# Patient Record
Sex: Male | Born: 1965 | Race: White | Hispanic: No | Marital: Married | Smoking: Former smoker
Health system: Southern US, Community
[De-identification: ages and names within clinical notes are randomized; demographics above are authoritative.]

## PROBLEM LIST (undated history)

## (undated) DIAGNOSIS — K219 Gastro-esophageal reflux disease without esophagitis: Secondary | ICD-10-CM

## (undated) DIAGNOSIS — F17200 Nicotine dependence, unspecified, uncomplicated: Secondary | ICD-10-CM

## (undated) DIAGNOSIS — D126 Benign neoplasm of colon, unspecified: Secondary | ICD-10-CM

## (undated) DIAGNOSIS — E119 Type 2 diabetes mellitus without complications: Secondary | ICD-10-CM

## (undated) DIAGNOSIS — T7840XA Allergy, unspecified, initial encounter: Secondary | ICD-10-CM

## (undated) DIAGNOSIS — R7989 Other specified abnormal findings of blood chemistry: Secondary | ICD-10-CM

## (undated) DIAGNOSIS — F32A Depression, unspecified: Secondary | ICD-10-CM

## (undated) DIAGNOSIS — F329 Major depressive disorder, single episode, unspecified: Secondary | ICD-10-CM

## (undated) DIAGNOSIS — E785 Hyperlipidemia, unspecified: Secondary | ICD-10-CM

## (undated) DIAGNOSIS — F419 Anxiety disorder, unspecified: Secondary | ICD-10-CM

## (undated) HISTORY — DX: Depression, unspecified: F32.A

## (undated) HISTORY — DX: Nicotine dependence, unspecified, uncomplicated: F17.200

## (undated) HISTORY — PX: TONSILLECTOMY: SUR1361

## (undated) HISTORY — DX: Type 2 diabetes mellitus without complications: E11.9

## (undated) HISTORY — DX: Benign neoplasm of colon, unspecified: D12.6

## (undated) HISTORY — DX: Anxiety disorder, unspecified: F41.9

## (undated) HISTORY — PX: WISDOM TOOTH EXTRACTION: SHX21

## (undated) HISTORY — PX: SHOULDER SURGERY: SHX246

## (undated) HISTORY — DX: Major depressive disorder, single episode, unspecified: F32.9

## (undated) HISTORY — DX: Gastro-esophageal reflux disease without esophagitis: K21.9

## (undated) HISTORY — DX: Hyperlipidemia, unspecified: E78.5

## (undated) HISTORY — DX: Allergy, unspecified, initial encounter: T78.40XA

## (undated) HISTORY — DX: Other specified abnormal findings of blood chemistry: R79.89

---

## 2008-11-27 ENCOUNTER — Encounter: Admission: RE | Admit: 2008-11-27 | Discharge: 2008-11-27 | Payer: Self-pay | Admitting: Emergency Medicine

## 2009-02-10 ENCOUNTER — Ambulatory Visit (HOSPITAL_BASED_OUTPATIENT_CLINIC_OR_DEPARTMENT_OTHER): Admission: RE | Admit: 2009-02-10 | Discharge: 2009-02-10 | Payer: Self-pay | Admitting: Orthopedic Surgery

## 2011-02-01 LAB — POCT HEMOGLOBIN-HEMACUE: Hemoglobin: 17.9 g/dL — ABNORMAL HIGH (ref 13.0–17.0)

## 2011-03-07 NOTE — Op Note (Signed)
David Dyer, David Dyer                 ACCOUNT NO.:  [id number]   MEDICAL RECORD NO.:  [id number]          PATIENT TYPE:  AMB   LOCATION:  DSC                          FACILITY:  [facility]   PHYSICIAN:  Feliberto Gottron. Turner Daniels, M.D.   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   POSTOPERATIVE DIAGNOSIS:  Left shoulder acromioclavicular  joint  arthritis, impingement syndrome, and degenerative tearing of the  superior labrum.   PROCEDURE:  Left shoulder arthroscopic anterior-inferior acromioplasty,  formal distal clavicle excision, and debridement of degenerative tearing  of the superior labrum.   SURGEON:  Feliberto Gottron. Turner Daniels, MD   FIRST ASSISTANT:  Shirl Harris, PA-C   ANESTHETIC:  General endotracheal plus interscalene block.   ESTIMATED BLOOD LOSS:  Minimal.   FLUID REPLACEMENT:  800 mL of crystalloid.   DRAINS PLACED:  None.   TOURNIQUET TIME:  None.   INDICATIONS FOR PROCEDURE:  The patient is a 45 year old gentleman with  left shoulder AC joint arthritis with cystic changes in the distal  clavicle and the acromion, also has symptoms consistent with impingement  syndrome and possible labral tears.  He has failed conservative  treatment with anti-inflammatory medicines and physical therapy, got  good temporary relief from differential injection of the [facility] joint in the  subacromial space and now desires elective arthroscopic evaluation and  treatment of his left shoulder including distal clavicle excision,  acromioplasty, and dressing in the intraarticular pathology, we may find  such as a labral tear.  He is at low risk for a rotator cuff tear by  clinical exam.  The risks and benefits of surgery have been discussed.  Questions are answered.   DESCRIPTION OF PROCEDURE:  The  patient was identified by armband and  taken to the Block Area of [facility].  Appropriate  anesthetic monitors were attached and left interscalene block anesthetic  was induced.  He was then taken to the operating room #6.  Appropriate  anesthetic monitors were reattached and general endotracheal anesthesia  was induced.  He was placed in the beach chair position and the left  upper extremity was prepped and draped in the usual sterile fashion from  the wrist to the hemithorax.  A standard time-out procedure was  performed and at this point we began the operation itself by making  portals 1.5 cm anterior to the [facility] joint with a #11 blade lateral to the  junction middle and posterior thirds of the acromion and posterior of  the posterolateral corner of the acromion process.  The inflow was  placed anteriorly, the arthroscope laterally, and a 4.2 great white  sucker shaver posteriorly allowing subacromial bursectomy which revealed  a fairly normal rotator cuff and a badly arthritic AC joint with  fragmentation of the distal clavicle and a type 2 subacromial spur.  Electrocautery was maintained with the ArthroCare wand at this point a  subacromial decompression  was accomplished using a 4.5 hooded Vortex bur  as well as removal of the distal inferior centimeter of the clavicle.  We then switched portals bringing the scope in posteriorly, the inflow  laterally, and the 4.5 hooded Vortex bur anteriorly completing the  distal clavicle excision.  The arthroscope was then repositioned in the  glenohumeral joint with the inflow attached, allowing [place] to evaluate the  glenohumeral joint.  The articular cartilage was in good condition of  the glenoid and humerus.  The subscapularis, supraspinatus, and  infraspinatus insertions were all intact.  The biceps tendon was in good  condition, but the superior labrum had degenerative tearing along the  inner rim and this was debrided back to a  stable margin using a 3.5  Gator sucker shaver coming in from the anterior portal.  We then probed  the labrum to make sure it was intact peripherally and the shoulder was  irrigated out with normal saline solution and a dressing of Xeroform, 4  x 4 dressing sponges, paper tape, and a sling applied.  The patient was  laid supine, awakened, and taken to the recovery room without  difficulty.      Feliberto Gottron. Turner Daniels, M.D.  Electronically Signed     FJR/MEDQ  D:  02/10/2009  T:  02/11/2009  Job:  [id number]

## 2011-06-07 ENCOUNTER — Ambulatory Visit (INDEPENDENT_AMBULATORY_CARE_PROVIDER_SITE_OTHER): Payer: BC Managed Care – PPO | Admitting: Medical

## 2011-06-07 ENCOUNTER — Encounter: Payer: Self-pay | Admitting: Medical

## 2011-06-07 DIAGNOSIS — F329 Major depressive disorder, single episode, unspecified: Secondary | ICD-10-CM

## 2011-06-07 DIAGNOSIS — R030 Elevated blood-pressure reading, without diagnosis of hypertension: Secondary | ICD-10-CM

## 2011-06-07 DIAGNOSIS — Z0000 Encounter for general adult medical examination without abnormal findings: Secondary | ICD-10-CM

## 2011-06-07 DIAGNOSIS — G5620 Lesion of ulnar nerve, unspecified upper limb: Secondary | ICD-10-CM

## 2011-06-07 DIAGNOSIS — R209 Unspecified disturbances of skin sensation: Secondary | ICD-10-CM

## 2011-06-07 DIAGNOSIS — F17200 Nicotine dependence, unspecified, uncomplicated: Secondary | ICD-10-CM

## 2011-06-07 DIAGNOSIS — R202 Paresthesia of skin: Secondary | ICD-10-CM

## 2011-06-07 LAB — CBC WITH DIFFERENTIAL/PLATELET
Basophils Absolute: 0.1 10*3/uL (ref 0.0–0.1)
Basophils Relative: 1 % (ref 0–1)
Eosinophils Absolute: 0.2 10*3/uL (ref 0.0–0.7)
Hemoglobin: 18.9 g/dL — ABNORMAL HIGH (ref 13.0–17.0)
MCH: 33.3 pg (ref 26.0–34.0)
MCHC: 35.6 g/dL (ref 30.0–36.0)
Monocytes Absolute: 0.8 10*3/uL (ref 0.1–1.0)
Monocytes Relative: 6 % (ref 3–12)
Neutrophils Relative %: 65 % (ref 43–77)
RDW: 13.5 % (ref 11.5–15.5)

## 2011-06-07 LAB — POCT URINALYSIS DIPSTICK
Glucose, UA: NEGATIVE
Nitrite, UA: NEGATIVE
Urobilinogen, UA: NEGATIVE

## 2011-06-07 LAB — COMPREHENSIVE METABOLIC PANEL
ALT: 67 U/L — ABNORMAL HIGH (ref 0–53)
AST: 43 U/L — ABNORMAL HIGH (ref 0–37)
Albumin: 5.1 g/dL (ref 3.5–5.2)
Alkaline Phosphatase: 56 U/L (ref 39–117)
BUN: 21 mg/dL (ref 6–23)
Chloride: 103 mEq/L (ref 96–112)
Potassium: 4.5 mEq/L (ref 3.5–5.3)
Sodium: 138 mEq/L (ref 135–145)

## 2011-06-07 LAB — TSH: TSH: 2.835 u[IU]/mL (ref 0.350–4.500)

## 2011-06-07 LAB — LIPID PANEL
Cholesterol: 259 mg/dL — ABNORMAL HIGH (ref 0–200)
LDL Cholesterol: 178 mg/dL — ABNORMAL HIGH (ref 0–99)
VLDL: 34 mg/dL (ref 0–40)

## 2011-06-07 LAB — VITAMIN B12: Vitamin B-12: 662 pg/mL (ref 211–911)

## 2011-06-07 MED ORDER — BUPROPION HCL ER (XL) 150 MG PO TB24: 150.0000 mg | ORAL_TABLET | ORAL | Status: DC

## 2011-06-07 NOTE — Progress Notes (Signed)
Subjective:   HPI  David Dyer is a 45 y.o. male who presents for a complete physical.  He hasn't been to the doctor in a few years. He wanted to come in to discuss several concerns.  He is here with his wife today.  He first notes several months of depressed mood. His father passed away in 12-03-2022 of cancer, and he took care of his father last 2 weeks of his life. This has deeply affected him and he is having a hard time getting over this.  This may have been partially related to him losing his job. He has crying spells, depressed mood, lack of desire to do things he once did. He notes in the remote past being on Paxil and Lexapro but this caused erectile dysfunction problems. The medication didn't seem to help his mood either. He typically has avoided counseling, as he has trust issues, does not want to open up to counselors.  He is not in church and does not have a Education officer, environmental.  He notes for months now he gets numbness in the outside of his lower legs, he also gets numbness in both hands mainly the last 3 fingers on the side. He is a Curator, and does Journalist, newspaper and auto glass work. He uses hands with his job and wondered if this caused the numbness.  He is a smoker, and would like to have help quitting smoking. He used Chantix briefly in the past but this made him very angry. He was able to quit smoking for brief period in the remote past with Zyban.  He notes a history of borderline cholesterol in the past. He wants general labs for screening today.  Reviewed their medical, surgical, family, social, medication, and allergy history and updated chart as appropriate.  Past Medical History  Diagnosis Date  . Tobacco use disorder     Past Surgical History  Procedure Date  . Shoulder surgery     arthroscopic - [facility]  . Tonsillectomy     Family History  Problem Relation Age of Onset  . Hyperlipidemia Mother   . Hypertension Mother   . Heart disease Mother 75    MI  . Cancer  Father     bladder  . Diabetes Father   . Alcohol abuse Father   . Cancer Maternal Grandmother     lung cancer  . Heart disease Maternal Grandfather   . Diabetes Paternal Grandfather   . Stroke Neg Hx     History   Social History  . Marital Status: Married    Spouse Name: N/A    Number of Children: N/A  . Years of Education: N/A   Occupational History  . Not on file.   Social History Main Topics  . Smoking status: Current Everyday Smoker -- 1.0 packs/day for 25 years    Types: Cigarettes  . Smokeless tobacco: Never Used  . Alcohol Use: 7.2 oz/week    12 Cans of beer per week  . Drug Use: No  . Sexually Active: Not on file     married, 1 daughter, unemployed.  Glass technician, Journalist, newspaper   Other Topics Concern  . Not on file   Social History Narrative  . No narrative on file    No current outpatient prescriptions on file prior to visit.    No Known Allergies   Review of Systems Constitutional: denies fever, chills, sweats, unexpected weight change, anorexia, fatigue Allergy: negative; denies recent sneezing, itching, congestion Dermatology: denies changing  moles, rash, lumps, new worrisome lesions ENT: no runny nose, ear pain, sore throat, hoarseness, sinus pain, teeth pain, tinnitus, hearing loss, epistaxis Cardiology: denies chest pain, palpitations, edema, orthopnea, paroxysmal nocturnal dyspnea Respiratory: denies cough, shortness of breath, dyspnea on exertion, wheezing, hemoptysis Gastroenterology: denies abdominal pain, nausea, vomiting, diarrhea, constipation, blood in stool, changes in bowel movement, dysphagia Hematology: denies bleeding or bruising problems Musculoskeletal: denies arthralgias, myalgias, joint swelling, back pain, neck pain, cramping, gait changes Ophthalmology: denies vision changes, eye redness, itching, discharge Urology: denies dysuria, difficulty urinating, hematuria, urinary frequency, urgency, incontinence Neurology:  +tingling and numbness in both outer lower legs, bilat hands, mostly last 3 fingers bilat,; no headache, weakness, speech abnormality, memory loss, falls, dizziness Psychology: +depressed mood     Objective:   Physical Exam  Filed Vitals:   06/07/11 0814  BP: 132/90  Pulse: 68  Temp: 98.1 F (36.7 C)  Resp: 20    General appearance: alert, no distress, WD/WN, white male HEENT: normocephalic, conjunctiva/corneas normal, sclerae anicteric, PERRLA, EOMi, nares patent, no discharge or erythema, pharynx normal Oral cavity: MMM, tongue normal, teeth normal Neck: supple, no lymphadenopathy, no thyromegaly, no masses, normal ROM, no JVD or bruits Chest: non tender, normal shape and expansion Heart: RRR, normal S1, S2, no murmurs Lungs: CTA bilaterally, no wheezes, rhonchi, or rales Abdomen: +bs, ventral reducible hernia noted on exam, soft, non tender, non distended, no masses, no hepatomegaly, no splenomegaly, no bruits Back: non tender, normal ROM, no scoliosis Musculoskeletal: upper extremities non tender, no obvious deformity, normal ROM throughout, lower extremities non tender, no obvious deformity, normal ROM throughout Extremities: no edema, no cyanosis, no clubbing Pulses: 2+ symmetric, upper and lower extremities, normal cap refill Neurological: alert, oriented x 3, CN2-12 intact, strength normal upper extremities and lower extremities, sensation normal throughout, DTRs 2+ throughout, no cerebellar signs, gait normal Psychiatric: tearful at times talking about his father; otherwise normal affect, behavior normal, pleasant  GU: Normal male external genitalia, no hernias, masses Skin: Left lower abdomen with a somewhat square flat macule, 6 mm x 6 mm, mostly brown but was a couple shades of brown.  Right neck with 1 cm x 4 mm pink, raised, somewhat verruca lesion, unchanged for years per patient.  Several scattered flat and benign appearing macules of the chest and back.   Assessment  :    Encounter Diagnoses  Name Primary?  . General medical examination Yes  . Tobacco use disorder   . Depression   . Paresthesia   . Ulnar nerve entrapment syndrome   . Elevated blood pressure (not hypertension)       Plan:    Physical exam - discussed healthy lifestyle, diet, exercise, preventative care, vaccinations, and addressed their concerns. Handout given on prevention and wellness.  Tobacco use disorder-discussed risk of tobacco use, advised he stop smoking, and he is interested in stop smoking.  Begin Wellbutrin, gave 1 800 quit now hotline and recommend he call for counseling.  Of note, he quit successfully with this in the past briefly.  Depression - advise he consider counseling do to degrees he has suffered with the loss of his father.  Begin Wellbutrin. Discussed risk and benefits of medication.  Advise he get in church and developed relationship with a pastor for counseling.   Recheck here in 3-4 weeks, call sooner prn.  Paresthesias-we will check a B12 and other labs today, not sure about the cause of his leg numbness and tingling  Ulnar nerve entrapment syndrome-advise he  have his wife wrap his arms in a towel to keep them straight during sleep, but if things progressively worsen then we will consider EMGs  Elevated BP - recheck next visit.

## 2011-06-07 NOTE — Patient Instructions (Signed)
Preventative Care for Adults, Male    1-800-QUIT-NOW hotline for tobacco cessation.      REGULAR HEALTH EXAMS:  A routine yearly physical is a good way to check in with your primary care provider about your health and preventive screening. It is also an opportunity to share updates about your health and any concerns you have, and receive a thorough all-over exam.   Most health insurance companies pay for at least some preventative services.  Check with your health plan for specific coverages.  WHAT PREVENTATIVE SERVICES DO MEN NEED?  Adult men should have their weight and blood pressure checked regularly.   Men age 81 and older should have their cholesterol levels checked regularly.  Beginning at age 61 and continuing to age 41, men should be screened for colorectal cancer.  Certain people should may need continued testing until age 57.  Other cancer screening may include exams for testicular and prostate cancer.  Updating vaccinations is part of preventative care.  Vaccinations help protect against diseases such as the flu.  Lab tests are generally done as part of preventative care to screen for anemia and blood disorders, to screen for problems with the kidneys and liver, to screen for bladder problems, to check blood sugar, and to check your cholesterol level.  Preventative services generally include counseling about diet, exercise, avoiding tobacco, drugs, excessive alcohol consumption, and sexually transmitted infections.    GENERAL RECOMMENDATIONS FOR GOOD HEALTH:  Healthy diet:  Eat a variety of foods, including fruit, vegetables, animal or vegetable protein, such as meat, fish, chicken, and eggs, or beans, lentils, tofu, and grains, such as rice.  Drink plenty of water daily.  Decrease saturated fat in the diet, avoid lots of red meat, processed foods, sweets, fast foods, and fried foods.  Exercise:  Aerobic exercise helps maintain good heart health. At least 30-40  minutes of moderate-intensity exercise is recommended. For example, a brisk walk that increases your heart rate and breathing. This should be done on most days of the week.   Find a type of exercise or a variety of exercises that you enjoy so that it becomes a part of your daily life.  Examples are running, walking, swimming, water aerobics, and biking.  For motivation and support, explore group exercise such as aerobic class, spin class, Zumba, Yoga,or  martial arts, etc.    Set exercise goals for yourself, such as a certain weight goal, walk or run in a race such as a 5k walk/run.  Speak to your primary care provider about exercise goals.  Disease prevention:  If you smoke or chew tobacco, find out from your caregiver how to quit. It can literally save your life, no matter how long you have been a tobacco user. If you do not use tobacco, never begin.   Maintain a healthy diet and normal weight. Increased weight leads to problems with blood pressure and diabetes.   The Body Mass Index or BMI is a way of measuring how much of your body is fat. Having a BMI above 27 increases the risk of heart disease, diabetes, hypertension, stroke and other problems related to obesity. Your caregiver can help determine your BMI and based on it develop an exercise and dietary program to help you achieve or maintain this important measurement at a healthful level.  High blood pressure causes heart and blood vessel problems.  Persistent high blood pressure should be treated with medicine if weight loss and exercise do not work.   Fat  and cholesterol leaves deposits in your arteries that can block them. This causes heart disease and vessel disease elsewhere in your body.  If your cholesterol is found to be high, or if you have heart disease or certain other medical conditions, then you may need to have your cholesterol monitored frequently and be treated with medication.   Ask if you should have a stress test if your  history suggests this. A stress test is a test done on a treadmill that looks for heart disease. This test can find disease prior to there being a problem.  Avoid drinking alcohol in excess (more than two drinks per day).  Avoid use of street drugs. Do not share needles with anyone. Ask for professional help if you need assistance or instructions on stopping the use of alcohol, cigarettes, and/or drugs.  Brush your teeth twice a day with fluoride toothpaste, and floss once a day. Good oral hygiene prevents tooth decay and gum disease. The problems can be painful, unattractive, and can cause other health problems. Visit your dentist for a routine oral and dental check up and preventive care every 6-12 months.   Look at your skin regularly.  Use a mirror to look at your back. Notify your caregivers of changes in moles, especially if there are changes in shapes, colors, a size larger than a pencil eraser, an irregular border, or development of new moles.  Safety:  Use seatbelts 100% of the time, whether driving or as a passenger.  Use safety devices such as hearing protection if you work in environments with loud noise or significant background noise.  Use safety glasses when doing any work that could send debris in to the eyes.  Use a helmet if you ride a bike or motorcycle.  Use appropriate safety gear for contact sports.  Talk to your caregiver about gun safety.  Use sunscreen with a SPF (or skin protection factor) of 15 or greater.  Lighter skinned people are at a greater risk of skin cancer. Don't forget to also wear sunglasses in order to protect your eyes from too much damaging sunlight. Damaging sunlight can accelerate cataract formation.   Practice safe sex. Use condoms. Condoms are used for birth control and to help reduce the spread of sexually transmitted infections (or STIs).  Some of the STIs are gonorrhea (the clap), chlamydia, syphilis, trichomonas, herpes, HPV (human papilloma virus) and  HIV (human immunodeficiency virus) which causes AIDS. The herpes, HIV and HPV are viral illnesses that have no cure. These can result in disability, cancer and death.   Keep carbon monoxide and smoke detectors in your home functioning at all times. Change the batteries every 6 months or use a model that plugs into the wall.   Vaccinations:  Stay up to date with your tetanus shots and other required immunizations. You should have a booster for tetanus every 10 years. Be sure to get your flu shot every year, since 5%-20% of the [place] population comes down with the flu. The flu vaccine changes each year, so being vaccinated once is not enough. Get your shot in the fall, before the flu season peaks.   Other vaccines to consider:  Pneumococcal vaccine to protect against certain types of pneumonia.  This is normally recommended for adults age 78 or older.  However, adults younger than 44 years old with certain underlying conditions such as diabetes, heart or lung disease should also receive the vaccine.  Shingles vaccine to protect against Varicella Zoster if you  are older than age 10, or younger than 44 years old with certain underlying illness.  Hepatitis A vaccine to protect against a form of infection of the liver by a virus acquired from food.  Hepatitis B vaccine to protect against a form of infection of the liver by a virus acquired from blood or body fluids, particularly if you work in health care.  If you plan to travel internationally, check with your local health department for specific vaccination recommendations.  Cancer Screening:  Most routine colon cancer screening begins at the age of 20. On a yearly basis, doctors may provide special easy to use take-home tests to check for hidden blood in the stool. Sigmoidoscopy or colonoscopy can detect the earliest forms of colon cancer and is life saving. These tests use a small camera at the end of a tube to directly examine the colon. Speak to  your caregiver about this at age 16, when routine screening begins (and is repeated every 5 years unless early forms of pre-cancerous polyps or small growths are found).   At the age of 78 men usually start screening for prostate cancer every year. Screening may begin at a younger age for those with higher risk. Those at higher risk include African-Americans or having a family history of prostate cancer. There are two types of tests for prostate cancer:   Prostate-specific antigen (PSA) testing. Recent studies raise questions about prostate cancer using PSA and you should discuss this with your caregiver.   Digital rectal exam (in which your doctor's lubricated and gloved finger feels for enlargement of the prostate through the anus).   Screening for testicular cancer.  Do a monthly exam of your testicles. Gently roll each testicle between your thumb and fingers, feeling for any abnormal lumps. The best time to do this is after a hot shower or bath when the tissues are looser. Notify your caregivers of any lumps, tenderness or changes in size or shape immediately.

## 2011-06-09 ENCOUNTER — Encounter: Payer: Self-pay | Admitting: *Deleted

## 2011-06-09 ENCOUNTER — Telehealth: Payer: Self-pay | Admitting: *Deleted

## 2011-06-09 NOTE — Telephone Encounter (Addendum)
Message copied by Dorthula Perfect on Fri Jun 09, 2011  5:02 PM ------      Message from: [place], [place]      Created: Fri Jun 09, 2011 12:56 PM       There are few abnormalities on labs including elevated liver test, elevated sugar, elevated cholesterol, and hemoglobin is elevated. Otherwise labs were okay. Let's have him come back in 2 weeks to discuss labs and medication Wellbutrin. He will need to do some additional studies at that time.  Left message on vm to return call regarding labs.  Sent letter and copy of labs to pt to call and schedule an appointment for 2 weeks to go over lab results and to discuss Wellbutrin.  CM, LPN

## 2011-06-19 ENCOUNTER — Encounter: Payer: Self-pay | Admitting: Medical

## 2011-06-19 ENCOUNTER — Ambulatory Visit (INDEPENDENT_AMBULATORY_CARE_PROVIDER_SITE_OTHER): Payer: BC Managed Care – PPO | Admitting: Medical

## 2011-06-19 VITALS — BP 112/88 | HR 60 | Temp 98.2°F | Resp 20 | Ht 69.0 in | Wt 193.5 lb

## 2011-06-19 DIAGNOSIS — R209 Unspecified disturbances of skin sensation: Secondary | ICD-10-CM

## 2011-06-19 DIAGNOSIS — E785 Hyperlipidemia, unspecified: Secondary | ICD-10-CM

## 2011-06-19 DIAGNOSIS — D582 Other hemoglobinopathies: Secondary | ICD-10-CM

## 2011-06-19 DIAGNOSIS — R748 Abnormal levels of other serum enzymes: Secondary | ICD-10-CM

## 2011-06-19 DIAGNOSIS — F17200 Nicotine dependence, unspecified, uncomplicated: Secondary | ICD-10-CM

## 2011-06-19 DIAGNOSIS — F329 Major depressive disorder, single episode, unspecified: Secondary | ICD-10-CM

## 2011-06-19 DIAGNOSIS — R030 Elevated blood-pressure reading, without diagnosis of hypertension: Secondary | ICD-10-CM

## 2011-06-19 DIAGNOSIS — R202 Paresthesia of skin: Secondary | ICD-10-CM

## 2011-06-19 DIAGNOSIS — R7301 Impaired fasting glucose: Secondary | ICD-10-CM

## 2011-06-19 NOTE — Progress Notes (Signed)
Subjective:   HPI David Dyer is a 45 y.o. male who presents for recheck. I saw him as a new patient on 06/07/11 for physical.  At that time he had multiple concerns including depressed mood, desires stop tobacco, numbness and tingling on and legs, and general screening.  His labs came back showing elevated glucose, elevated liver test, elevated hemoglobin, high cholesterol.  He reports no prior history of elevated liver test or abnormal glucose.  He does report drinking 2 or more alcoholic beverages a day for years, but in the last 2 weeks has tried to cut back on this.  Given his father's death earlier this year, he does report that he was self-medicating with alcohol and Xanax.  He still reports ongoing tingling and numbness in the outside of his legs.  At times his feet feel cold.  He denies pain in his legs or feet though. No other aggravating or relieving factors.  No other c/o.  The following portions of the patient's history were reviewed and updated as appropriate: allergies, current medications, past family history, past medical history, past social history, past surgical history and problem list.  Past Medical History  Diagnosis Date  . Tobacco use disorder     Review of Systems Gen.: No fever, chills, sweats, weight changes Skin: No rashes, erythema Heart: No chest pain, palpitations Lungs: No shortness breath, cough Abdomen: No abdominal pain, nausea, vomiting, blood in stool GU: Negative Neuro: No weakness, loss of bowel or bladder function    Objective:   Physical Exam  General appearance: alert, no distress, WD/WN, white male Skin: unremarkable Musculoskeletal: Lower extremities non-tender, normal range of motion, no obvious deformity Back: Mild lumbar tenderness paraspinally and midline, range of motion about 90% of normal, -SLR Neuro: Sensation seems a little blunted throughout his lower legs, although he is able to distinguish light and sharp touch, lower extremity DTRs  normal, strength normal Pulse:  1+ lower extremity pulses, symmetric, upper extremity pulses normal Extremities: no edema, cyanosis or clubbing   Assessment :    Encounter Diagnoses  Name Primary?  . Impaired fasting glucose Yes  . Elevated liver enzymes   . Hyperlipidemia   . Depressive disorder, not elsewhere classified   . Tobacco use disorder   . Elevated blood pressure reading without diagnosis of hypertension   . Abnormal hemoglobin   . Paresthesia      Plan:     Impaired fasting glucose-hemoglobin A1c 6.1% today.  Advised he is at risk for diabetes. We discussed diet and exercise changes. Recheck labs in 6 month  Elevated liver enzymes-likely alcohol related.  His recent AST was 43, ALT 67. Advise he obtained from alcohol or greatly reduced his intake, and we discussed other workup. At this point we will recheck liver enzymes in 3 months. If still elevated, we will get workup to evaluate elevated liver test  Hyperlipidemia-discussed diet changes, increase whole-grain in diet, avoid lots of red meat, avoid processed foods, exercise regularly, recheck labs in 6 months  Depression-continue Wellbutrin, call report in 2 weeks.  If not at goal in 2 weeks we'll increase to Wellbutrin XR 300 mg daily.  Tobacco use disorder-he is improving, has been tobacco free for 3 days now.  Continue Wellbutrin, call report in 2 weeks  Elevated blood pressure-improved on check today  Abnormal hemoglobin-likely tobacco related.  Recheck labs in 3 months  Paresthesia- likely related to his elevated blood sugar and alcohol use.  Recheck on this in 3 months. In  the meantime can try over-the-counter tonic water, one half cup daily, or try magnesium and potassium tablets over-the-counter daily.  If no improvement in 3 months, consider ABIs an EMG/nerve conduction studies

## 2011-07-06 ENCOUNTER — Telehealth: Payer: Self-pay | Admitting: *Deleted

## 2011-07-06 ENCOUNTER — Other Ambulatory Visit: Payer: Self-pay | Admitting: Medical

## 2011-07-06 MED ORDER — BUPROPION HCL ER (XL) 300 MG PO TB24: 300.0000 mg | ORAL_TABLET | ORAL | Status: DC

## 2011-07-06 NOTE — Telephone Encounter (Signed)
Wellbutrin 300 sent.  F/u in 50mo recheck on this.

## 2011-07-06 NOTE — Telephone Encounter (Signed)
Pt aware of prescription sent to pharmacy and will call back in a month to schedule an appointment. CM, LPN

## 2011-07-10 ENCOUNTER — Encounter: Payer: Self-pay | Admitting: Medical

## 2011-07-10 ENCOUNTER — Ambulatory Visit (INDEPENDENT_AMBULATORY_CARE_PROVIDER_SITE_OTHER): Payer: BC Managed Care – PPO | Admitting: Medical

## 2011-07-10 VITALS — BP 120/82 | HR 78 | Temp 98.2°F | Ht 70.0 in | Wt 199.0 lb

## 2011-07-10 DIAGNOSIS — R209 Unspecified disturbances of skin sensation: Secondary | ICD-10-CM

## 2011-07-10 DIAGNOSIS — R202 Paresthesia of skin: Secondary | ICD-10-CM

## 2011-07-10 DIAGNOSIS — F329 Major depressive disorder, single episode, unspecified: Secondary | ICD-10-CM

## 2011-07-10 DIAGNOSIS — F17200 Nicotine dependence, unspecified, uncomplicated: Secondary | ICD-10-CM

## 2011-07-10 NOTE — Progress Notes (Signed)
Subjective:   HPI  David Dyer is a 45 y.o. male who presents for recheck.    Here for recheck on depression.  He is seeing improvement on Wellbutrin XR 300mg  daily.  He has been on the 150mg  dose up until last week.  He still has moments when gets agitated.  He is not working currently.  He is still mourning the loss of his father, going through his father's belongings now to try and get a handle on things.  He is trying to settle his father's estate.  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  His wife currently is an administrative person at her job, but will likely become part of his business once things get moving. He has not seen a Veterinary surgeon.  He has quit smoking 3wk ago.   Wellbutrin is really helping in this regard.    He still has some numbness in his legs, but less so than prior.  No other new c/o with this.   Here for recheck on elevated BP from his first visit.  At his first visit here his BP was elevated, but it has not been elevated since.  He notes that he was nervous and certainly not feeling that well at his first visit.  No other c/o.  The following portions of the patient's history were reviewed and updated as appropriate: allergies, current medications, past family history, past medical history, past social history, past surgical history and problem list.  Past Medical History  Diagnosis Date  . Tobacco use disorder   . Depression     Review of Systems Constitutional: denies fever, chills, sweats, unexpected weight change Gastroenterology: denies abdominal pain, nausea, vomiting, diarrhea, constipation Musculoskeletal: denies arthralgias, myalgias, joint swelling, back pain Urology: denies dysuria, difficulty urinating, hematuria, urinary frequency, urgency     Objective:   Physical Exam  General appearance: alert, no distress, WD/WN, white male Psych: pleasant, good eye contact  Assessment :   Encounter Diagnoses  Name Primary?  . Depression  Yes  . Tobacco use disorder   . Paresthesia      Plan:    Depression - We recently increased him to Wellbutrin 300 mg XR last week.  We will give this more time.  Discussed his concerns, agitation, mood, and discussed ways to cope. He declines counseling at this time.  We talked about goal setting, having a daily "to do" list, keeping busy, getting exercise.  He will call report in 2wk to let me know if the higher dose of Wellbutrin seems to be working ok.   Tobacco use disorder - he quit 3 wk ago.   Congratulated him on his efforts.  He will hopefully remain tobacco free.   Paresthesia - somewhat improved.  Recheck on labs in 2-3 mo.

## 2011-09-19 ENCOUNTER — Encounter: Payer: Self-pay | Admitting: Medical

## 2011-09-19 ENCOUNTER — Ambulatory Visit (INDEPENDENT_AMBULATORY_CARE_PROVIDER_SITE_OTHER): Payer: BC Managed Care – PPO | Admitting: Medical

## 2011-09-19 VITALS — BP 120/80 | HR 68 | Temp 98.5°F | Resp 16 | Wt 209.0 lb

## 2011-09-19 DIAGNOSIS — F329 Major depressive disorder, single episode, unspecified: Secondary | ICD-10-CM

## 2011-09-19 DIAGNOSIS — N529 Male erectile dysfunction, unspecified: Secondary | ICD-10-CM

## 2011-09-19 DIAGNOSIS — E785 Hyperlipidemia, unspecified: Secondary | ICD-10-CM | POA: Insufficient documentation

## 2011-09-19 DIAGNOSIS — R748 Abnormal levels of other serum enzymes: Secondary | ICD-10-CM

## 2011-09-19 LAB — HEPATIC FUNCTION PANEL
ALT: 43 U/L (ref 0–53)
Albumin: 4.6 g/dL (ref 3.5–5.2)
Total Protein: 7.2 g/dL (ref 6.0–8.3)

## 2011-09-19 LAB — LIPID PANEL
Cholesterol: 244 mg/dL — ABNORMAL HIGH (ref 0–200)
LDL Cholesterol: 160 mg/dL — ABNORMAL HIGH (ref 0–99)
Triglycerides: 248 mg/dL — ABNORMAL HIGH (ref <150)

## 2011-09-19 MED ORDER — BUPROPION HCL ER (XL) 300 MG PO TB24: 300.0000 mg | ORAL_TABLET | ORAL | Status: DC

## 2011-09-19 MED ORDER — SILDENAFIL CITRATE 50 MG PO TABS: 50.0000 mg | ORAL_TABLET | ORAL | Status: DC | PRN

## 2011-09-19 NOTE — Progress Notes (Signed)
Subjective:   HPI  David Dyer is a 45 y.o. male who presents for recheck.   Last visit was September regarding depression.  He is here for recheck today on lab abnormalities in August - cholesterol and liver abnormalities.  Since starting on Wellbutrin a few months ago, he feels like this medicine has really helped him keep even on his mood. Unfortunately though he continues to deal with settling his father's estate who passed away this past year, and he notes that his only brother tragically died from an accidental gunshot wound within the past couple months. He was out hunting and apparently the gun accidentally fired, and autopsy was done, no foul play suspected. He says he is handling things reasonably well, and at times gets anxious, but overall the Wellbutrin has made a big difference. He has a daughter who lives in [place] finishing out her college. Otherwise he has been in good health, no particular complaints.  He did restart smoking after the death of his brother, but had been smoke-free for months.  No other c/o.  The following portions of the patient's history were reviewed and updated as appropriate: allergies, current medications, past family history, past medical history, past social history, past surgical history and problem list.  Past Medical History  Diagnosis Date  . Tobacco use disorder   . Depression    Review of Systems Constitutional: -fever, -chills, -sweats, -unexpected -weight change,-fatigue ENT: -runny nose, -ear pain, -sore throat Cardiology:  -chest pain, -palpitations, -edema Respiratory: -cough, -shortness of breath, -wheezing Gastroenterology: -abdominal pain, -nausea, -vomiting, -diarrhea, -constipation Hematology: -bleeding or bruising problems Musculoskeletal: -arthralgias, -myalgias, -joint swelling, +back pain Ophthalmology: -vision changes Urology: -dysuria, -difficulty urinating, -hematuria, -urinary frequency, -urgency Neurology: -headache, -weakness,  -tingling, -numbness     Objective:   Physical Exam  Filed Vitals:   09/19/11 0807  BP: 120/80  Pulse: 68  Temp: 98.5 F (36.9 C)  Resp: 16    General appearance: alert, no distress, WD/WN  Oral cavity: MMM, no lesions Neck: supple, no lymphadenopathy, no thyromegaly, no masses Heart: RRR, normal S1, S2, no murmurs Lungs: CTA bilaterally, no wheezes, rhonchi, or rales Abdomen: +bs, soft, non tender, non distended, no masses, no hepatomegaly, no splenomegaly Pulses: 2+ symmetric, upper and lower extremities, normal cap refill   Assessment and Plan :    Encounter Diagnoses  Name Primary?  . Elevated liver enzymes Yes  . Hyperlipidemia   . Depression   . Erectile dysfunction    Elevated liver enzymes-recheck liver panel today, and if still elevated, will need additional evaluation  Hyperlipidemia-recheck labs today, if still elevated, consider medication.  He has made diet and exercise changes since we first checked his lipids in August  Depression-continue Wellbutrin 300 mg a day, consider counseling, and we discussed his current stressors, ways to deal with his issues.  Given all that he has been through in the last year, he seems to be handling things pretty well at the moment, says Wellbutrin helps quite a bit.  Recheck in 3 months.  Erectile dysfunction-I reviewed his labs from August, discussed that he likely has a combination of psychogenic as well as organic abnormalities. We will use a trial of Viagra. Discussed risk and benefits of medication, and possible complications, how to take the medication properly, and gave 4 samples. He will call back in 2-3 weeks and let me know how this is working.  Follow-up pending labs

## 2011-09-21 ENCOUNTER — Other Ambulatory Visit: Payer: Self-pay | Admitting: Medical

## 2011-09-21 MED ORDER — PRAVASTATIN SODIUM 40 MG PO TABS: 40.0000 mg | ORAL_TABLET | Freq: Every evening | ORAL | Status: DC

## 2011-10-05 ENCOUNTER — Telehealth: Payer: Self-pay | Admitting: Internal Medicine

## 2011-10-05 MED ORDER — BUPROPION HCL ER (XL) 300 MG PO TB24: 300.0000 mg | ORAL_TABLET | ORAL | Status: DC

## 2011-10-05 NOTE — Telephone Encounter (Signed)
Refilled bupropion Hcl XL 300 mg tablet

## 2012-01-01 ENCOUNTER — Encounter: Payer: Self-pay | Admitting: Medical

## 2012-01-01 ENCOUNTER — Ambulatory Visit (INDEPENDENT_AMBULATORY_CARE_PROVIDER_SITE_OTHER): Payer: BC Managed Care – PPO | Admitting: Medical

## 2012-01-01 VITALS — BP 120/80 | HR 60 | Temp 98.4°F | Resp 16 | Wt 195.5 lb

## 2012-01-01 DIAGNOSIS — F329 Major depressive disorder, single episode, unspecified: Secondary | ICD-10-CM

## 2012-01-01 DIAGNOSIS — Z87891 Personal history of nicotine dependence: Secondary | ICD-10-CM

## 2012-01-01 DIAGNOSIS — E785 Hyperlipidemia, unspecified: Secondary | ICD-10-CM

## 2012-01-01 DIAGNOSIS — Z79899 Other long term (current) drug therapy: Secondary | ICD-10-CM

## 2012-01-01 DIAGNOSIS — R7301 Impaired fasting glucose: Secondary | ICD-10-CM

## 2012-01-01 NOTE — Progress Notes (Signed)
Subjective:   HPI  David Dyer is a 46 y.o. male who presents for recheck.  He has been compliant with his cholesterol medication, has lost weight, eating healthy, eating red meat only once weekly if that.  Exercising, feeling better.  He has stopped tobacco again.  He is doing ok on Wellbutrin, but still has some anxiety issues at times.   He really doesn't want to add any medication on.  Overall does ok on the Wellbutrin.  No other aggravating or relieving factors.    No other c/o.  The following portions of the patient's history were reviewed and updated as appropriate: allergies, current medications, past family history, past medical history, past social history, past surgical history and problem list.  Past Medical History  Diagnosis Date  . Tobacco use disorder   . Depression     Allergies  Allergen Reactions  . Chantix (Varenicline Tartrate)     Made him angry    Review of Systems ROS reviewed and was negative other than noted in HPI or above.    Objective:   Physical Exam  General appearance: alert, no distress, WD/WN Heart: RRR, normal S1, S2, no murmurs Lungs: CTA bilaterally, no wheezes, rhonchi, or rales Abdomen: +bs, soft, non tender, non distended, no masses, no hepatomegaly, no splenomegaly Pulses: 2+ symmetric Ext: no edema  Assessment and Plan :     Encounter Diagnoses  Name Primary?  . Hyperlipidemia Yes  . Impaired fasting blood sugar   . Encounter for long-term (current) use of other medications   . Depression   . Former smoker     Labs today.  He has been doing well with diet and exercise, has lost some wight again, compliant with meds.   He is doing ok on Wellbutrin for now.  He has stopped tobacco again.  We will call with labs results and plan.

## 2012-01-02 LAB — LIPID PANEL
HDL: 34 mg/dL — ABNORMAL LOW (ref >39)
LDL Cholesterol: 90 mg/dL (ref 0–99)
Total CHOL/HDL Ratio: 5 Ratio
VLDL: 46 mg/dL — ABNORMAL HIGH (ref 0–40)

## 2012-01-02 LAB — ALT: ALT: 66 U/L — ABNORMAL HIGH (ref 0–53)

## 2012-01-29 ENCOUNTER — Other Ambulatory Visit: Payer: Self-pay | Admitting: Medical

## 2017-06-13 ENCOUNTER — Encounter: Payer: Self-pay | Admitting: Gastroenterology

## 2017-07-03 ENCOUNTER — Ambulatory Visit: Payer: Self-pay | Admitting: Registered"

## 2017-07-10 ENCOUNTER — Encounter: Payer: Self-pay | Admitting: Registered"

## 2017-07-10 ENCOUNTER — Encounter: Payer: 59 | Attending: *Deleted | Admitting: Registered"

## 2017-07-10 DIAGNOSIS — E119 Type 2 diabetes mellitus without complications: Secondary | ICD-10-CM | POA: Insufficient documentation

## 2017-07-10 DIAGNOSIS — Z713 Dietary counseling and surveillance: Secondary | ICD-10-CM | POA: Diagnosis present

## 2017-07-10 NOTE — Progress Notes (Signed)
Diabetes Self-Management Education  Visit Type: First/Initial  Appt. Start Time: 0800 Appt. End Time: 0925  07/10/2017  Mr. David Dyer, identified by name and date of birth, is a 51 y.o. male with a diagnosis of Diabetes: Type 2.   ASSESSMENT This patient is accompanied in the office by his spouse. Pt states he has family history of diabetes, high cholesterol and heart disease. Pt spouse provided food diary and meals look well balanced and maybe a little low on the carbohydrates.  Sleep: 4-5 hrs/night. Pt states partly due to anxiety about work waking him up with mind racing. Pt states he is working on being less of a perfectionist.  Eating behavior: Pt states they eat meals, sitting on couch while watching TV.  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 Pt states some types of physical activity hurt his back.  RD provided glucometer and instruction using it. Meter: One Touch Verio Lot: [id number] X Exp. 10/22/2018 BG: 173 mg/dL (ate banana with medication 2 hrs ago)     Diabetes Self-Management Education - 07/10/17 0811      Visit Information   Visit Type First/Initial     Initial Visit   Diabetes Type Type 2   Are you currently following a meal plan? No   Are you taking your medications as prescribed? Yes   Date Diagnosed 3 weeks ago     Health Coping   How would you rate your overall health? Good     Psychosocial Assessment   Patient Belief/Attitude about Diabetes Motivated to manage diabetes   How often do you need to have someone help you when you read instructions, pamphlets, or other written materials from your doctor or pharmacy? 1 - Never   What is the last grade level you completed in school? 12     Complications   Last HgB A1C per patient/outside source 11.6 %  per pt   How often do you check your blood sugar? 0 times/day (not testing)   Have you had a dilated eye exam in the  past 12 months? Yes   Have you had a dental exam in the past 12 months? Yes   Are you checking your feet? No     Dietary Intake   Breakfast 2 egg frittata, veggies, grits, milk   Snack (morning) none or fruit or nuts   Lunch 2 sandwiches, cheese, nuts, banana   Dinner lean meat, 2x week, veggie   Snack (evening) fruit OR nuts   Beverage(s) water, milk, coffee stevia     Exercise   Exercise Type ADL's   How many days per week to you exercise? 0   How many minutes per day do you exercise? 0   Total minutes per week of exercise 0     Patient Education   Previous Diabetes Education No   Disease state  Definition of diabetes, type 1 and 2, and the diagnosis of diabetes;Factors that contribute to the development of diabetes   Nutrition management  Role of diet in the treatment of diabetes and the relationship between the three main macronutrients and blood glucose level;Food label reading, portion sizes and measuring food.;Carbohydrate counting   Physical activity and exercise  Role of exercise on diabetes management, blood pressure control and cardiac health.   Monitoring Taught/evaluated SMBG meter.;Purpose and frequency of SMBG.   Acute complications Taught treatment of hypoglycemia - the 15 rule.   Chronic  complications Relationship between chronic complications and blood glucose control;Lipid levels, blood glucose control and heart disease   Psychosocial adjustment Role of stress on diabetes     Individualized Goals (developed by patient)   Nutrition Follow meal plan discussed   Physical Activity Exercise 3-5 times per week   Monitoring  test my blood glucose as discussed   Health Coping Other (comment)  try mindfulness course     Outcomes   Expected Outcomes Demonstrated interest in learning. Expect positive outcomes   Future DMSE PRN   Program Status Completed      Individualized Plan for Diabetes Self-Management Training:   Learning Objective:  Patient will have a  greater understanding of diabetes self-management. Patient education plan is to attend individual and/or group sessions per assessed needs and concerns.   Patient Instructions  For stress reduction you may find the free 8 week mindfulness course helpful [email or web address]/ Taking magnesium in the evening may help with sleep. May want to consider extended release melatonin.  Plan:  Aim for 3-4 Carb Choices per meal (45-60 grams) +/- 1 either way  Aim for 0-2 Carb Choices (0-30 grams) per snack if hungry  Include protein in moderation with your meals and snacks Consider reading food labels for Total Carbohydrate and Sat Fat Grams of foods Consider increasing your activity level 3-5 times week for 30-45 minutes daily as tolerated Consider checking BG, fasting and 2 hours after a meal a few times a week. Continue taking medication as directed by MD   Expected Outcomes:  Demonstrated interest in learning. Expect positive outcomes  Education material provided: Living Well with Diabetes, A1C conversion sheet, Support group flyer and Carbohydrate counting sheet, BG Log book  If problems or questions, patient to contact team via:  Phone and MyChart  Future DSME appointment: PRN

## 2017-07-10 NOTE — Patient Instructions (Signed)
For stress reduction you may find the free 8 week mindfulness course helpful https://[email or web address]/ Taking magnesium in the evening may help with sleep. May want to consider extended release melatonin.  Plan:  Aim for 3-4 Carb Choices per meal (45-60 grams) +/- 1 either way  Aim for 0-2 Carb Choices (0-30 grams) per snack if hungry  Include protein in moderation with your meals and snacks Consider reading food labels for Total Carbohydrate and Sat Fat Grams of foods Consider increasing your activity level 3-5 times week for 30-45 minutes daily as tolerated Consider checking BG, fasting and 2 hours after a meal a few times a week. Continue taking medication as directed by MD

## 2017-07-17 ENCOUNTER — Ambulatory Visit (AMBULATORY_SURGERY_CENTER): Payer: Self-pay | Admitting: *Deleted

## 2017-07-17 VITALS — Ht 69.0 in | Wt 208.0 lb

## 2017-07-17 DIAGNOSIS — Z1211 Encounter for screening for malignant neoplasm of colon: Secondary | ICD-10-CM

## 2017-07-17 MED ORDER — NA SULFATE-K SULFATE-MG SULF 17.5-3.13-1.6 GM/177ML PO SOLN: 1.0000 | Freq: Once | ORAL | 0 refills | Status: AC

## 2017-07-17 NOTE — Progress Notes (Signed)
No egg or soy allergy known to patient  No issues with past sedation with any surgeries  or procedures, no intubation problems  No diet pills per patient No home 02 use per patient  No blood thinners per patient  Pt denies issues with constipation  No A fib or A flutter  EMMI video sent to pt's e mail pt declined   

## 2017-07-20 ENCOUNTER — Encounter: Payer: Self-pay | Admitting: Gastroenterology

## 2017-08-01 ENCOUNTER — Ambulatory Visit (AMBULATORY_SURGERY_CENTER): Payer: 59 | Admitting: Gastroenterology

## 2017-08-01 ENCOUNTER — Encounter: Payer: Self-pay | Admitting: Gastroenterology

## 2017-08-01 VITALS — BP 98/62 | HR 74 | Temp 98.0°F | Resp 14 | Ht 69.0 in | Wt 208.0 lb

## 2017-08-01 DIAGNOSIS — D125 Benign neoplasm of sigmoid colon: Secondary | ICD-10-CM | POA: Diagnosis not present

## 2017-08-01 DIAGNOSIS — Z1211 Encounter for screening for malignant neoplasm of colon: Secondary | ICD-10-CM

## 2017-08-01 DIAGNOSIS — D123 Benign neoplasm of transverse colon: Secondary | ICD-10-CM

## 2017-08-01 DIAGNOSIS — K649 Unspecified hemorrhoids: Secondary | ICD-10-CM | POA: Diagnosis not present

## 2017-08-01 DIAGNOSIS — K635 Polyp of colon: Secondary | ICD-10-CM | POA: Diagnosis not present

## 2017-08-01 MED ORDER — SODIUM CHLORIDE 0.9 % IV SOLN: 500.0000 mL | INTRAVENOUS | Status: DC

## 2017-08-01 NOTE — Progress Notes (Signed)
No problems noted in the recovery room. maw 

## 2017-08-01 NOTE — Patient Instructions (Signed)
YOU HAD AN ENDOSCOPIC PROCEDURE TODAY AT [facility]:   Refer to the procedure report that was given to you for any specific questions about what was found during the examination.  If the procedure report does not answer your questions, please call your gastroenterologist to clarify.  If you requested that your care partner not be given the details of your procedure findings, then the procedure report has been included in a sealed envelope for you to review at your convenience later.  YOU SHOULD EXPECT: Some feelings of bloating in the abdomen. Passage of more gas than usual.  Walking can help get rid of the air that was put into your GI tract during the procedure and reduce the bloating. If you had a lower endoscopy (such as a colonoscopy or flexible sigmoidoscopy) you may notice spotting of blood in your stool or on the toilet paper. If you underwent a bowel prep for your procedure, you may not have a normal bowel movement for a few days.  Please Note:  You might notice some irritation and congestion in your nose or some drainage.  This is from the oxygen used during your procedure.  There is no need for concern and it should clear up in a day or so.  SYMPTOMS TO REPORT IMMEDIATELY:   Following lower endoscopy (colonoscopy or flexible sigmoidoscopy):  Excessive amounts of blood in the stool  Significant tenderness or worsening of abdominal pains  Swelling of the abdomen that is new, acute  Fever of 100F or higher   For urgent or emergent issues, a gastroenterologist can be reached at any hour by calling [phone].   DIET:  We do recommend a small meal at first, but then you may proceed to your regular diet.  Drink plenty of fluids but you should avoid alcoholic beverages for 24 hours.  ACTIVITY:  You should plan to take it easy for the rest of today and you should NOT DRIVE or use heavy machinery until tomorrow (because of the sedation medicines used during the test).     FOLLOW UP: Our staff will call the number listed on your records the next business day following your procedure to check on you and address any questions or concerns that you may have regarding the information given to you following your procedure. If we do not reach you, we will leave a message.  However, if you are feeling well and you are not experiencing any problems, there is no need to return our call.  We will assume that you have returned to your regular daily activities without incident.  If any biopsies were taken you will be contacted by phone or by letter within the next 1-3 weeks.  Please call us at [phone] if you have not heard about the biopsies in 3 weeks.    SIGNATURES/CONFIDENTIALITY: You and/or your care partner have signed paperwork which will be entered into your electronic medical record.  These signatures attest to the fact that that the information above on your After Visit Summary has been reviewed and is understood.  Full responsibility of the confidentiality of this discharge information lies with you and/or your care-partner.    Handouts were given to your care partner on polyps and hemorrhoids. Your blood sugar was 109 in the recovery room. NO ASPIRIN, ASPIRIN CONTAINING PRODUCTS (BC OR GOODY POWDERS) OR NSAIDS (IBUPROFEN, ADVIL, ALEVE, AND MOTRIN) FOR 10 days; TYLENOL IS OK TO TAKE. You may resume your current medications today. Await biopsy  results. Please call if any questions or concerns.

## 2017-08-01 NOTE — Op Note (Signed)
[facility] Patient Name: David Dyer Procedure Date: 08/01/2017 9:32 AM MRN: [id number] Endoscopist: Milus Banister , MD Age: 51 Referring MD:  Date of Birth: August 29, 1966 Gender: Male Account #: [id number] Procedure:                Colonoscopy Indications:              Screening for colorectal malignant neoplasm Medicines:                Monitored Anesthesia Care Procedure:                Pre-Anesthesia Assessment:                           - Prior to the procedure, a History and Physical                            was performed, and patient medications and                            allergies were reviewed. The patient's tolerance of                            previous anesthesia was also reviewed. The risks                            and benefits of the procedure and the sedation                            options and risks were discussed with the patient.                            All questions were answered, and informed consent                            was obtained. Prior Anticoagulants: The patient has                            taken no previous anticoagulant or antiplatelet                            agents. ASA Grade Assessment: II - A patient with                            mild systemic disease. After reviewing the risks                            and benefits, the patient was deemed in                            satisfactory condition to undergo the procedure.                           After obtaining informed consent, the colonoscope  was passed under direct vision. Throughout the                            procedure, the patient's blood pressure, pulse, and                            oxygen saturations were monitored continuously. The                            Colonoscope was introduced through the anus and                            advanced to the the cecum, identified by                            appendiceal orifice and  ileocecal valve. The                            colonoscopy was performed without difficulty. The                            patient tolerated the procedure well. The quality                            of the bowel preparation was good. The ileocecal                            valve, appendiceal orifice, and rectum were                            photographed. Scope In: 9:37:08 AM Scope Out: 9:51:02 AM Scope Withdrawal Time: 0 hours 12 minutes 13 seconds  Total Procedure Duration: 0 hours 13 minutes 54 seconds  Findings:                 Three sessile polyps were found in the transverse                            colon. The polyps were 2 to 4 mm in size. These                            polyps were removed with a cold snare. Resection                            and retrieval were complete.                           A 11 mm polyp was found in the distal sigmoid                            colon. The polyp was sessile. The polyp was removed                            with a hot snare. Resection  and retrieval were                            complete.                           External and internal hemorrhoids were found. The                            hemorrhoids were small.                           The exam was otherwise without abnormality on                            direct and retroflexion views. Complications:            No immediate complications. Estimated blood loss:                            None. Estimated Blood Loss:     Estimated blood loss: none. Impression:               - Three 2 to 4 mm polyps in the transverse colon,                            removed with a cold snare. Resected and retrieved.                           - One 11 mm polyp in the distal sigmoid colon,                            removed with a hot snare. Resected and retrieved.                           - External and internal hemorrhoids.                           - The examination was otherwise normal on  direct                            and retroflexion views. Recommendation:           - Patient has a contact number available for                            emergencies. The signs and symptoms of potential                            delayed complications were discussed with the                            patient. Return to normal activities tomorrow.                            Written discharge instructions were provided to the  patient.                           - Resume previous diet.                           - Continue present medications.                           You will receive a letter within 2-3 weeks with the                            pathology results and my final recommendations.                           If the polyp(s) is proven to be 'pre-cancerous' on                            pathology, you will need repeat colonoscopy in 3-5                            years. If the polyp(s) is NOT 'precancerous' on                            pathology then you should repeat colon cancer                            screening in 10 years with colonoscopy without need                            for colon cancer screening by any method prior to                            then (including stool testing). Milus Banister, MD 08/01/2017 9:54:00 AM This report has been signed electronically.

## 2017-08-01 NOTE — Progress Notes (Signed)
Pt's states no medical or surgical changes since previsit or office visit. 

## 2017-08-01 NOTE — Progress Notes (Signed)
Called to room to assist during endoscopic procedure.  Patient ID and intended procedure confirmed with present staff. Received instructions for my participation in the procedure from the performing physician.  

## 2017-08-01 NOTE — Progress Notes (Signed)
Report given to PACU, vss 

## 2017-08-02 ENCOUNTER — Telehealth: Payer: Self-pay | Admitting: *Deleted

## 2017-08-02 NOTE — Telephone Encounter (Signed)
  Follow up Call-  Call back number 08/01/2017  Post procedure Call Back phone  # [phone] cell   Permission to leave phone message Yes  Some recent data might be hidden     Patient questions:  Do you have a fever, pain , or abdominal swelling? No. Pain Score  0 *  Have you tolerated food without any problems? Yes.    Have you been able to return to your normal activities? Yes.    Do you have any questions about your discharge instructions: Diet   No. Medications  No. Follow up visit  No.  Do you have questions or concerns about your Care? No.  Actions: * If pain score is 4 or above: No action needed, pain <4.

## 2017-08-07 ENCOUNTER — Encounter: Payer: Self-pay | Admitting: Gastroenterology

## 2020-07-29 ENCOUNTER — Encounter: Payer: Self-pay | Admitting: *Deleted

## 2020-07-29 ENCOUNTER — Ambulatory Visit: Payer: 59 | Admitting: Nurse Practitioner

## 2020-07-29 ENCOUNTER — Other Ambulatory Visit (INDEPENDENT_AMBULATORY_CARE_PROVIDER_SITE_OTHER): Payer: 59

## 2020-07-29 VITALS — BP 100/70 | HR 97 | Ht 69.0 in | Wt 181.0 lb

## 2020-07-29 DIAGNOSIS — R7989 Other specified abnormal findings of blood chemistry: Secondary | ICD-10-CM | POA: Diagnosis not present

## 2020-07-29 DIAGNOSIS — Z800 Family history of malignant neoplasm of digestive organs: Secondary | ICD-10-CM | POA: Diagnosis not present

## 2020-07-29 DIAGNOSIS — Z86010 Personal history of colonic polyps: Secondary | ICD-10-CM | POA: Diagnosis not present

## 2020-07-29 LAB — CBC WITH DIFFERENTIAL/PLATELET
Basophils Absolute: 0 10*3/uL (ref 0.0–0.1)
Basophils Relative: 0.3 % (ref 0.0–3.0)
Eosinophils Absolute: 0.1 10*3/uL (ref 0.0–0.7)
Eosinophils Relative: 1.1 % (ref 0.0–5.0)
HCT: 48 % (ref 39.0–52.0)
Hemoglobin: 16.4 g/dL (ref 13.0–17.0)
Lymphocytes Relative: 33.5 % (ref 12.0–46.0)
Lymphs Abs: 2.8 10*3/uL (ref 0.7–4.0)
MCHC: 34.2 g/dL (ref 30.0–36.0)
MCV: 93.9 fl (ref 78.0–100.0)
Monocytes Absolute: 0.6 10*3/uL (ref 0.1–1.0)
Monocytes Relative: 7.2 % (ref 3.0–12.0)
Neutro Abs: 4.8 10*3/uL (ref 1.4–7.7)
Neutrophils Relative %: 57.9 % (ref 43.0–77.0)
Platelets: 210 10*3/uL (ref 150.0–400.0)
RBC: 5.12 Mil/uL (ref 4.22–5.81)
RDW: 12.8 % (ref 11.5–15.5)
WBC: 8.2 10*3/uL (ref 4.0–10.5)

## 2020-07-29 LAB — HEPATIC FUNCTION PANEL
ALT: 163 U/L — ABNORMAL HIGH (ref 0–53)
AST: 65 U/L — ABNORMAL HIGH (ref 0–37)
Albumin: 4.7 g/dL (ref 3.5–5.2)
Alkaline Phosphatase: 38 U/L — ABNORMAL LOW (ref 39–117)
Bilirubin, Direct: 0.1 mg/dL (ref 0.0–0.3)
Total Bilirubin: 0.4 mg/dL (ref 0.2–1.2)
Total Protein: 8.1 g/dL (ref 6.0–8.3)

## 2020-07-29 LAB — BASIC METABOLIC PANEL
BUN: 15 mg/dL (ref 6–23)
CO2: 27 mEq/L (ref 19–32)
Calcium: 9.8 mg/dL (ref 8.4–10.5)
Chloride: 105 mEq/L (ref 96–112)
Creatinine, Ser: 0.86 mg/dL (ref 0.40–1.50)
GFR: 98.73 mL/min (ref >60.00)
Glucose, Bld: 95 mg/dL (ref 70–99)
Potassium: 4.1 mEq/L (ref 3.5–5.1)
Sodium: 142 mEq/L (ref 135–145)

## 2020-07-29 LAB — GAMMA GT: GGT: 115 U/L — ABNORMAL HIGH (ref 7–51)

## 2020-07-29 LAB — FERRITIN: Ferritin: 222.9 ng/mL (ref 22.0–322.0)

## 2020-07-29 LAB — IRON: Iron: 203 ug/dL — ABNORMAL HIGH (ref 42–165)

## 2020-07-29 MED ORDER — SUTAB 1479-225-188 MG PO TABS: 1.0000 | ORAL_TABLET | ORAL | 0 refills | Status: DC

## 2020-07-29 NOTE — Patient Instructions (Signed)
Your provider has requested that you go to the basement level for lab work before leaving today. Press "B" on the elevator. The lab is located at the first door on the left as you exit the elevator. __________________________________________________________  David Dyer have been scheduled for a colonoscopy. Please follow written instructions given to you at your visit today.  Please pick up your prep supplies at the pharmacy within the next 1-3 days. If you use inhalers (even only as needed), please bring them with you on the day of your procedure. ___________________________________________________________  David Dyer have been scheduled for an limited abdominal ultrasound at [facility] (1st floor of hospital) on Thursday 08/05/20 at 8:00 am. Please arrive 15 minutes prior to your appointment for registration. Make certain not to have anything to eat or drink 6 hours prior to your appointment. Should you need to reschedule your appointment, please contact radiology at [phone]. This test typically takes about 30 minutes to perform. __________________________________________________________  Further follow up will be determined after the above evaluations have been completed. ___________________________________________________________  If you are age 21 or older, your body mass index should be between 23-30. Your Body mass index is 26.73 kg/m. If this is out of the aforementioned range listed, please consider follow up with your Primary Care Provider.  If you are age 26 or younger, your body mass index should be between 19-25. Your Body mass index is 26.73 kg/m. If this is out of the aformentioned range listed, please consider follow up with your Primary Care Provider.   _____________________________________________________________  Due to recent changes in healthcare laws, you may see the results of your imaging and laboratory studies on MyChart before your provider has had a chance to review  them.  We understand that in some cases there may be results that are confusing or concerning to you. Not all laboratory results come back in the same time frame and the provider may be waiting for multiple results in order to interpret others.  Please give [place] 48 hours in order for your provider to thoroughly review all the results before contacting the office for clarification of your results.

## 2020-07-29 NOTE — Progress Notes (Signed)
I agree with the above note, plan 

## 2020-07-29 NOTE — Progress Notes (Signed)
07/29/2020 David Dyer [id number] Nov 17, 1965   CHIEF COMPLAINT: Elevated liver enzymes   HISTORY OF PRESENT ILLNESS:  David Dyer is a 54 year old male with a history of anxiety, depression, DM II, hyperlipidemia, elevated LFTs, GERD and colon polyps. Past tonsillectomy. He presents to our office today as referred by Shanon Rosser PA-C for further evaluation regarding elevated LFTs. Routine labs 04/29/2020 showed elevated LFTS. AST 73. ALT 191. Alk phos 40. T. Bili 0.4. WBC 6.9. Hg 15.8. HCT 45.1. MCV 92.6. PLT 247. TSH 4.11. Cholesterol 205. LDL 125. HDL 57.  He reports having a history of elevated LFTs for the past 10 years.  ALT 67 - 66 in 2012-2013. He has a history of hypercholesterolemia for which he reports taking  Lipitor for the past 5 years. No history of alcohol abuse. He drinks 3 to 4 beers on Friday and Saturday evenings. No history of drug abuse. His father was an alcoholic and he thinks he had liver cancer. Mother with history of hypercholesterolemia and elevated LFTs. No antibiotic within the past year. He intermittently takes NSAIDS. He denies having any N/V. No upper or lower abdominal pain. Past GERD symptoms resolved. He is passing a normal brown formed stool daily. No rectal bleeding or black stools.  His appetite is good.  His weight is stable.  He underwent colonoscopy by Dr. Ardis Hughs 08/01/2017, a 11 mm polyp was removed from the distal sigmoid colon and 3 small polyps were removed from the transverse colon.  Biopsies were consistent with tubular adenomatous/hyperplastic polyps.  He was advised to repeat a colonoscopy 07/2020.  Mother with history of colon polyps.  No family history of colorectal cancer.  No other complaints today.  Hepatic Function Latest Ref Rng & Units 01/01/2012 09/19/2011 06/07/2011  Total Protein 6.0 - 8.3 g/dL - 7.2 7.9  Albumin 3.5 - 5.2 g/dL - 4.6 5.1  AST 0 - 37 U/L - 33 43(H)  ALT 0 - 53 U/L 66(H) 43 67(H)  Alk Phosphatase 39 - 117 U/L - 57 56  Total  Bilirubin 0.3 - 1.2 mg/dL - 0.6 0.9  Bilirubin, Direct 0.0 - 0.3 mg/dL - 0.1 -    Colonoscopy 08/01/2017 by Dr. Ardis Hughs: - Three 2 to 4 mm polyps in the transverse colon, removed with a cold snare. Resected and retrieved. - One 11 mm polyp in the distal sigmoid colon, removed with a hot snare. Resected and retrieved. - External and internal hemorrhoids. - The examination was otherwise normal on direct and retroflexion views. -3 year recall.   Surgical [P], transverse and sigmoid, polyp (4) - TUBULAR ADENOMA (X4 FRAGMENTS). - HYPERPLASTIC POLYP. - NO HIGH GRADE DYSPLASIA OR MALIGNANCY  Past Medical History:  Diagnosis Date  . Allergy   . Anxiety   . Depression   . Diabetes mellitus without complication ([place])   . Elevated LFTs   . GERD (gastroesophageal reflux disease)   . Hyperlipidemia   . Tobacco use disorder   . Tubular adenoma of colon    Past Surgical History:  Procedure Laterality Date  . SHOULDER SURGERY     arthroscopic - [facility]  . TONSILLECTOMY    . WISDOM TOOTH EXTRACTION     with sedation   Social History:  He is married. He is a Development worker, community carrier. He has one daughter.  He smoked cigarettes 1ppd for 40 years.  He quit smoking in 2016. He drinks 3 to 4 beers on Friday and Saturdays. He smokes marijuana daily.  Family History: Mother 25 with hypercholesterolemia, Raynaud's heart disease and colon polyps. Father died age 75 with history of DM, alcohol abuse and liver cancer    Allergies  Allergen Reactions  . Chantix [Varenicline Tartrate]     Made him angry      Outpatient Encounter Medications as of 07/29/2020  Medication Sig  . atorvastatin (LIPITOR) 20 MG tablet Take 20 mg by mouth daily.  Marland Kitchen b complex vitamins tablet Take 1 tablet by mouth daily.  . busPIRone (BUSPAR) 15 MG tablet buspirone 15 mg tablet  TK 2 TS PO BID-30 mg total  . metFORMIN (GLUCOPHAGE) 500 MG tablet Take 500 mg by mouth 2 (two) times daily with a meal.  . mirtazapine  (REMERON) 30 MG tablet Take 30 mg by mouth at bedtime.  . nortriptyline (PAMELOR) 25 MG capsule TK ONE C PO BID  . OVER THE COUNTER MEDICATION daily. Tumeric powder mixed in ACV and water daily  . Sodium Sulfate-Mag Sulfate-KCl (SUTAB) [phone] MG TABS Take 1 kit by mouth as directed. MANUFACTURER CODES!! BIN: [id number] PCN: CN GROUP: [id number] MEMBER ID: [id number];FFM AS CASH;NO PRIOR AUTHORIZATION  . [DISCONTINUED] MAGNESIUM CITRATE PO Take by mouth.  . [DISCONTINUED] sildenafil (VIAGRA) 50 MG tablet Take 1 tablet (50 mg total) by mouth as needed for erectile dysfunction.  . [DISCONTINUED] traZODone (DESYREL) 50 MG tablet TK 1 TO 2 TS PO QHS PRF SLP   No facility-administered encounter medications on file as of 07/29/2020.     REVIEW OF SYSTEMS:  Gen: Denies fever, sweats or chills. No weight loss.  CV: Denies chest pain, palpitations or edema. Resp: Denies cough, shortness of breath of hemoptysis.  GI: Denies heartburn, dysphagia, stomach or lower abdominal pain. No diarrhea or constipation.  GU : Denies urinary burning, blood in urine, increased urinary frequency or incontinence. MS: Denies joint pain, muscles aches or weakness. Derm: Denies rash, itchiness, skin lesions or unhealing ulcers. Psych: + anxiety and depression.  Heme: Denies bruising, bleeding. Neuro:  Denies headaches, dizziness or paresthesias. Endo:  + DM II.   PHYSICAL EXAM: BP 100/70   Pulse 97   Ht _0  (1.753 m)   Wt 181 lb (82.1 kg)   BMI 26.73 kg/m  General: Well developed  54 year old male in no acute distress. Head: Normocephalic and atraumatic. Eyes:  Sclerae non-icteric, conjunctive pink. Ears: Normal auditory acuity. Mouth: Dentition intact. No ulcers or lesions.  Neck: Supple, no lymphadenopathy or thyromegaly.  Lungs: Clear bilaterally to auscultation without wheezes, crackles or rhonchi. Heart: Regular rate and rhythm. No murmur, rub or gallop appreciated.  Abdomen: Soft, nontender, non  distended. No masses. No hepatosplenomegaly. Normoactive bowel sounds x 4 quadrants.  Rectal: Deferred.  Musculoskeletal: Symmetrical with no gross deformities. Skin: Warm and dry. No rash or lesions on visible extremities. Extremities: No edema. Neurological: Alert oriented x 4, no focal deficits.  Psychological:  Alert and cooperative. Normal mood and affect.  ASSESSMENT AND PLAN:  69.  54 year old male with a history of elevated LFTs since 2012. Rising LFTs per labs done 04/2020.  -CBC, BMP, Hepatic panel, Hepatitis A total ab, Hep B surface ag, Hep B surface antibody, Hep B core total ab,  Hep C antibody,  IgG, ANA, SMA, ceruloplasmin, GGT, A1AT and ceruloplasmin -Right upper quadrant abdominal ultrasound -Lipitor as previously prescribed -Reduce alcohol intake   2.  History of polyps. Colonoscopy 08/01/2017 one 98m polyp and three 2 to 4 mm polyps were removed from the transverse and sigmoid colon.  Pathology consistent with tubular adenomatous/hyperplastic.  -Colonoscopy benefits and risks discussed including risk with sedation, risk of bleeding, perforation and infection   Further evaluation to be determined after the above evaluation completed          CC:  Long, Scott, PA-C

## 2020-08-05 ENCOUNTER — Ambulatory Visit (HOSPITAL_COMMUNITY): Payer: 59

## 2020-08-05 LAB — HEPATITIS B SURFACE ANTIBODY,QUALITATIVE: Hep B S Ab: NONREACTIVE

## 2020-08-05 LAB — MITOCHONDRIAL ANTIBODIES: Mitochondrial M2 Ab, IgG: <=20 U

## 2020-08-05 LAB — HEPATITIS B SURFACE ANTIGEN: Hepatitis B Surface Ag: NONREACTIVE

## 2020-08-05 LAB — HEPATITIS B CORE ANTIBODY, TOTAL: Hep B Core Total Ab: NONREACTIVE

## 2020-08-05 LAB — ANTI-NUCLEAR AB-TITER (ANA TITER): ANA Titer 1: 1:80 {titer} — ABNORMAL HIGH

## 2020-08-05 LAB — HEPATITIS C ANTIBODY
Hepatitis C Ab: REACTIVE — AB
SIGNAL TO CUT-OFF: 32 — ABNORMAL HIGH (ref <1.00)

## 2020-08-05 LAB — HCV RNA,QUANTITATIVE REAL TIME PCR
HCV Quantitative Log: 6.61 Log IU/mL — ABNORMAL HIGH
HCV RNA, PCR, QN: 4120000 IU/mL — ABNORMAL HIGH

## 2020-08-05 LAB — IGG: IgG (Immunoglobin G), Serum: 1045 mg/dL (ref 600–1640)

## 2020-08-05 LAB — ANA: Anti Nuclear Antibody (ANA): POSITIVE — AB

## 2020-08-05 LAB — ANTI-SMOOTH MUSCLE ANTIBODY, IGG: Actin (Smooth Muscle) Antibody (IGG): <20 U (ref <20)

## 2020-08-05 LAB — CERULOPLASMIN: Ceruloplasmin: 27 mg/dL (ref 18–36)

## 2020-08-05 LAB — ALPHA-1-ANTITRYPSIN: A-1 Antitrypsin, Ser: 170 mg/dL (ref 83–199)

## 2020-08-10 ENCOUNTER — Other Ambulatory Visit: Payer: Self-pay

## 2020-08-10 ENCOUNTER — Ambulatory Visit (HOSPITAL_COMMUNITY): Payer: 59

## 2020-08-10 DIAGNOSIS — R748 Abnormal levels of other serum enzymes: Secondary | ICD-10-CM

## 2020-08-11 ENCOUNTER — Other Ambulatory Visit: Payer: 59

## 2020-08-11 DIAGNOSIS — R748 Abnormal levels of other serum enzymes: Secondary | ICD-10-CM

## 2020-08-14 LAB — HEPATITIS C GENOTYPE

## 2020-08-14 LAB — HIV ANTIBODY (ROUTINE TESTING W REFLEX): HIV 1&2 Ab, 4th Generation: NONREACTIVE

## 2020-08-14 LAB — HEPATITIS A ANTIBODY, TOTAL: Hepatitis A AB,Total: NONREACTIVE

## 2020-08-17 ENCOUNTER — Other Ambulatory Visit: Payer: Self-pay

## 2020-08-17 ENCOUNTER — Ambulatory Visit (HOSPITAL_COMMUNITY)
Admission: RE | Admit: 2020-08-17 | Discharge: 2020-08-17 | Disposition: A | Discharge status: Home / Self Care | Payer: 59 | Source: Ambulatory Visit | Attending: Nurse Practitioner | Admitting: Nurse Practitioner

## 2020-08-17 DIAGNOSIS — Z800 Family history of malignant neoplasm of digestive organs: Secondary | ICD-10-CM | POA: Diagnosis present

## 2020-08-17 DIAGNOSIS — Z86010 Personal history of colonic polyps: Secondary | ICD-10-CM | POA: Diagnosis present

## 2020-08-17 DIAGNOSIS — R7989 Other specified abnormal findings of blood chemistry: Secondary | ICD-10-CM | POA: Diagnosis present

## 2020-08-18 ENCOUNTER — Other Ambulatory Visit: Payer: Self-pay

## 2020-08-19 ENCOUNTER — Telehealth: Payer: Self-pay

## 2020-08-19 ENCOUNTER — Ambulatory Visit (INDEPENDENT_AMBULATORY_CARE_PROVIDER_SITE_OTHER): Payer: 59 | Admitting: Internal Medicine

## 2020-08-19 ENCOUNTER — Ambulatory Visit (INDEPENDENT_AMBULATORY_CARE_PROVIDER_SITE_OTHER): Payer: 59 | Admitting: Gastroenterology

## 2020-08-19 ENCOUNTER — Other Ambulatory Visit: Payer: Self-pay

## 2020-08-19 ENCOUNTER — Encounter: Payer: Self-pay | Admitting: Internal Medicine

## 2020-08-19 VITALS — BP 124/80 | HR 90 | Temp 97.8°F | Ht 69.0 in | Wt 184.0 lb

## 2020-08-19 DIAGNOSIS — B182 Chronic viral hepatitis C: Secondary | ICD-10-CM

## 2020-08-19 DIAGNOSIS — Z23 Encounter for immunization: Secondary | ICD-10-CM

## 2020-08-19 NOTE — Patient Instructions (Signed)
Thank you for using our service  It appears you do have chronic hepatitis C   We will start [place] for 8 weeks Insurance might make you get a special ultrasound of your liver before approving for the medication. It is ordered and will be scheduled for you   Once you start on medication, make a follow up with [place] exactly 28 days or 4 weeks after the first day of medications for [place] to renew and review labs   Thank you

## 2020-08-19 NOTE — Progress Notes (Signed)
Dr. Ardis Hughs, [place] patient was seen by ID for chronic hep C Gt1a tx

## 2020-08-19 NOTE — Telephone Encounter (Signed)
[facility] Patient Teacher, English as a foreign language completed.    The patient is insured through [place].  Insurance will pay for [employer] but will need a PA . I verified his income & household size , and patient will like his medication shipped to his home.  We will continue to follow to see if copay assistance is needed.  Ileene Patrick, [facility] Patient [facility] for [facility] Disease Phone: [phone] Fax:  [phone]

## 2020-08-19 NOTE — Progress Notes (Signed)
[facility] for Infectious Disease  Reason for Consult:hep c     Patient Active Problem List   Diagnosis Date Noted  . History of colonic polyps 07/29/2020  . Newly diagnosed diabetes ([place]) 07/10/2017  . Elevated liver enzymes 09/19/2011  . Hyperlipidemia 09/19/2011  . Depression 09/19/2011  . Erectile dysfunction 09/19/2011    Patient's Medications  New Prescriptions   No medications on file  Previous Medications   ATORVASTATIN (LIPITOR) 20 MG TABLET    Take 20 mg by mouth daily.   ATORVASTATIN (LIPITOR) 40 MG TABLET    Take 40 mg by mouth daily.   B COMPLEX VITAMINS TABLET    Take 1 tablet by mouth daily.   BUSPIRONE (BUSPAR) 15 MG TABLET    buspirone 15 mg tablet  TK 2 TS PO BID-30 mg total   GLUCOSE BLOOD TEST STRIP    OneTouch Verio test strips  USE TO CHECK BLLOD SUGARS TWICE DAILY AS DIRECTED   METFORMIN (GLUCOPHAGE) 500 MG TABLET    Take 500 mg by mouth 2 (two) times daily with a meal.   MIRTAZAPINE (REMERON) 30 MG TABLET    Take 30 mg by mouth at bedtime.   MUPIROCIN OINTMENT (BACTROBAN) 2 %    mupirocin 2 % topical ointment   NORTRIPTYLINE (PAMELOR) 25 MG CAPSULE    TK ONE C PO BID   NORTRIPTYLINE (PAMELOR) 50 MG CAPSULE    nortriptyline 50 mg capsule  Take 1 capsule twice a day by oral route as directed.   OVER THE COUNTER MEDICATION    daily. Tumeric powder mixed in ACV and water daily   SODIUM SULFATE-MAG SULFATE-KCL (SUTAB) [phone] MG TABS    Take 1 kit by mouth as directed. MANUFACTURER CODES!! BIN: [id number] PCN: CN GROUP: [id number] MEMBER ID: [id number];MCN AS CASH;NO PRIOR AUTHORIZATION  Modified Medications   No medications on file  Discontinued Medications   No medications on file    HPI: David Dyer is a 54 y.o. 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 Patient been married 15 years.   Quit smoking  Reviewed labs No s/s hep c complication extraintestinal No s/s cirrhosis  Discussed transmission risk and need to not share personal hygiene equipment Discussed avoiding nephrotoxin, raw sea food Discussed healthy lifestyle living   Lab Results  Component Value Date   PLT 210.0 07/29/2020    Review of Systems: ROS  Negative 11 point ros unless mentioned above  Past Medical History:  Diagnosis Date  . Allergy   . Anxiety   . Depression   . Diabetes mellitus without complication ([place])   . Elevated LFTs   . GERD (gastroesophageal reflux disease)   . Hyperlipidemia   . Tobacco use disorder   . Tubular adenoma of colon     Social History   Tobacco Use  . Smoking status: Former Smoker    Packs/day: 1.00    Years: 25.00    Pack years: 25.00    Types: Cigarettes    Quit date: 06/19/2011  Years since quitting: 9.1  . Smokeless tobacco: Former Network engineer Use Topics  . Alcohol use: Yes    Alcohol/week: 0.0 standard drinks    Comment: socially  . Drug use: Yes    Frequency: 7.0 times per week    Types: Marijuana    Comment: daily    Family History  Problem Relation Age of Onset  . Hyperlipidemia Mother   . Hypertension Mother   . Heart disease Mother 57       MI  . Colon polyps Mother   . Cancer Father        bladder  . Diabetes Father   . Alcohol abuse Father   . Cancer Maternal Grandmother        lung cancer  . Heart disease Maternal Grandfather   . Diabetes Paternal Grandfather   . Stroke Neg Hx   . Colon cancer Neg Hx   . Esophageal cancer Neg Hx   . Rectal cancer Neg Hx   . Stomach cancer Neg Hx    Allergies  Allergen Reactions  . Chantix  [Varenicline Tartrate]     Made him angry    OBJECTIVE: Vitals:   08/19/20 0910  BP: 124/80  Pulse: 90  Temp: 97.8 F (36.6 C)  TempSrc: Oral  SpO2: 99%  Weight: 184 lb (83.5 kg)  Height: '5\' 9"'  (1.753 m)   Body mass index is 27.17 kg/m.   Physical Exam  No distress, thin male, pleasant Heent: Nomrocephalic, per, conj clear; eomi Neck supple cv rrr no mrg Lungs clear abd s/nt Ext no edema Skin no rash Neuro cn2-12 intact, strength reflex symmetric, normal gait, no tremor Psych alert/oriented msk no cva tenderness   Lab: Reviewed 07/2020 fib 4 1.28  Microbiology: No results found for this or any previous visit (from the past 240 hour(s)).  Serology:  Imaging:   Assessment/plan: o    Patient Active Problem List   Diagnosis Date Noted  . History of colonic polyps 07/29/2020  . Newly diagnosed diabetes ([place]) 07/10/2017  . Elevated liver enzymes 09/19/2011  . Hyperlipidemia 09/19/2011  . Depression 09/19/2011  . Erectile dysfunction 09/19/2011     Problem List Items Addressed This Visit    None      #chronic hep c per history/labs.  fib4 score and story also consistent with no evidence advance fibrosis tx naieve Will use pan-genotypic mavyret of 8 weeks for tx Patient need to stop lipitor for the duration of tx  Hygyene, lifestyle, avoiding toxin / raw seafood, and getting hep b-a vaccinations discussed  -liver elastography -preclinic labs 4 weeks once starting mayret of 8 weeks -stop lipitor  Butch Penny our pharmacy team will work on preauthorization and contact patient when he starts   I am having Charlena Cross maintain his b complex vitamins, atorvastatin, busPIRone, metFORMIN, OVER THE COUNTER MEDICATION, nortriptyline, mirtazapine, Sutab, glucose blood, mupirocin ointment, atorvastatin, and nortriptyline.   No orders of the defined types were placed in this encounter.    Follow-up: No follow-ups on file.  Jabier Mutton, [facility] for Infectious Disease [place] -- -- pager   [phone] cell 08/19/2020, 9:43 AM

## 2020-08-20 LAB — COMPREHENSIVE METABOLIC PANEL
AG Ratio: 1.6 (calc) (ref 1.0–2.5)
ALT: 117 U/L — ABNORMAL HIGH (ref 9–46)
AST: 47 U/L — ABNORMAL HIGH (ref 10–35)
Albumin: 4.4 g/dL (ref 3.6–5.1)
Alkaline phosphatase (APISO): 37 U/L (ref 35–144)
BUN: 22 mg/dL (ref 7–25)
CO2: 29 mmol/L (ref 20–32)
Calcium: 9.7 mg/dL (ref 8.6–10.3)
Chloride: 104 mmol/L (ref 98–110)
Creat: 0.97 mg/dL (ref 0.70–1.33)
Globulin: 2.8 g/dL (calc) (ref 1.9–3.7)
Glucose, Bld: 141 mg/dL — ABNORMAL HIGH (ref 65–99)
Potassium: 4.7 mmol/L (ref 3.5–5.3)
Sodium: 140 mmol/L (ref 135–146)
Total Bilirubin: 0.3 mg/dL (ref 0.2–1.2)
Total Protein: 7.2 g/dL (ref 6.1–8.1)

## 2020-08-20 LAB — PROTIME-INR
INR: 1
Prothrombin Time: 10.9 s (ref 9.0–11.5)

## 2020-08-20 LAB — CBC WITH DIFFERENTIAL/PLATELET
Absolute Monocytes: 600 cells/uL (ref 200–950)
Basophils Absolute: 83 cells/uL (ref 0–200)
Basophils Relative: 1.2 %
Eosinophils Absolute: 159 cells/uL (ref 15–500)
Eosinophils Relative: 2.3 %
HCT: 43.7 % (ref 38.5–50.0)
Hemoglobin: 15.7 g/dL (ref 13.2–17.1)
Lymphs Abs: 2470 cells/uL (ref 850–3900)
MCH: 33.1 pg — ABNORMAL HIGH (ref 27.0–33.0)
MCHC: 35.9 g/dL (ref 32.0–36.0)
MCV: 92.2 fL (ref 80.0–100.0)
MPV: 10.4 fL (ref 7.5–12.5)
Monocytes Relative: 8.7 %
Neutro Abs: 3588 cells/uL (ref 1500–7800)
Neutrophils Relative %: 52 %
Platelets: 234 10*3/uL (ref 140–400)
RBC: 4.74 10*6/uL (ref 4.20–5.80)
RDW: 11.8 % (ref 11.0–15.0)
Total Lymphocyte: 35.8 %
WBC: 6.9 10*3/uL (ref 3.8–10.8)

## 2020-08-20 LAB — AFP TUMOR MARKER: AFP-Tumor Marker: 5.2 ng/mL (ref <6.1)

## 2020-08-25 ENCOUNTER — Telehealth: Payer: Self-pay

## 2020-08-25 ENCOUNTER — Other Ambulatory Visit: Payer: Self-pay | Admitting: Internal Medicine

## 2020-08-25 ENCOUNTER — Ambulatory Visit (HOSPITAL_COMMUNITY)
Admission: RE | Admit: 2020-08-25 | Discharge: 2020-08-25 | Disposition: A | Discharge status: Home / Self Care | Payer: 59 | Source: Ambulatory Visit | Attending: Internal Medicine | Admitting: Internal Medicine

## 2020-08-25 ENCOUNTER — Other Ambulatory Visit: Payer: Self-pay

## 2020-08-25 DIAGNOSIS — B182 Chronic viral hepatitis C: Secondary | ICD-10-CM | POA: Diagnosis present

## 2020-08-25 NOTE — Telephone Encounter (Signed)
Received call from radiology to clarify [place] orders. Patient recently had a [place] completed on 08/17/20. Call placed to Dr. Gale Journey and verbal order received for only elastography.  Verbal order confirmed and understood. Radiology team will update orders in epic.  David Dyer

## 2020-09-01 ENCOUNTER — Encounter: Payer: Self-pay | Admitting: Gastroenterology

## 2020-09-01 ENCOUNTER — Ambulatory Visit (AMBULATORY_SURGERY_CENTER): Payer: 59 | Admitting: Gastroenterology

## 2020-09-01 ENCOUNTER — Other Ambulatory Visit: Payer: Self-pay

## 2020-09-01 VITALS — BP 119/71 | HR 79 | Temp 98.7°F | Resp 13 | Ht 69.0 in | Wt 184.0 lb

## 2020-09-01 DIAGNOSIS — D129 Benign neoplasm of anus and anal canal: Secondary | ICD-10-CM

## 2020-09-01 DIAGNOSIS — Z86010 Personal history of colonic polyps: Secondary | ICD-10-CM

## 2020-09-01 DIAGNOSIS — D128 Benign neoplasm of rectum: Secondary | ICD-10-CM

## 2020-09-01 MED ORDER — SODIUM CHLORIDE 0.9 % IV SOLN: 500.0000 mL | Freq: Once | INTRAVENOUS | Status: AC

## 2020-09-01 NOTE — Progress Notes (Signed)
Called to room to assist during endoscopic procedure.  Patient ID and intended procedure confirmed with present staff. Received instructions for my participation in the procedure from the performing physician.  

## 2020-09-01 NOTE — Progress Notes (Signed)
pt tolerated well. VSS. awake and to recovery. Report given to RN.  

## 2020-09-01 NOTE — Op Note (Signed)
[facility] Patient Name: David Dyer Procedure Date: 09/01/2020 2:04 PM MRN: [id number] Endoscopist: Milus Banister , MD Age: 54 Referring MD:  Date of Birth: Nov 03, 1965 Gender: Male Account #: [id number] Procedure:                Colonoscopy Indications:              High risk colon cancer surveillance: Personal                            history of colonic polyps; Colonoscopy 2018 four                            polyps, including 3 TAs one of which was 37mm Medicines:                Monitored Anesthesia Care Procedure:                Pre-Anesthesia Assessment:                           - Prior to the procedure, a History and Physical                            was performed, and patient medications and                            allergies were reviewed. The patient's tolerance of                            previous anesthesia was also reviewed. The risks                            and benefits of the procedure and the sedation                            options and risks were discussed with the patient.                            All questions were answered, and informed consent                            was obtained. Prior Anticoagulants: The patient has                            taken no previous anticoagulant or antiplatelet                            agents. ASA Grade Assessment: II - A patient with                            mild systemic disease. After reviewing the risks                            and benefits, the patient was deemed in  satisfactory condition to undergo the procedure.                           After obtaining informed consent, the colonoscope                            was passed under direct vision. Throughout the                            procedure, the patient's blood pressure, pulse, and                            oxygen saturations were monitored continuously. The                            Colonoscope was  introduced through the anus and                            advanced to the the cecum, identified by                            appendiceal orifice and ileocecal valve. The                            colonoscopy was performed without difficulty. The                            patient tolerated the procedure well. The quality                            of the bowel preparation was good. The ileocecal                            valve, appendiceal orifice, and rectum were                            photographed. Scope In: 2:08:33 PM Scope Out: 2:21:33 PM Scope Withdrawal Time: 0 hours 9 minutes 59 seconds  Total Procedure Duration: 0 hours 13 minutes 0 seconds  Findings:                 Three sessile polyps were found in the rectum. The                            polyps were 1 to 2 mm in size. These polyps were                            removed with a cold snare. Resection and retrieval                            were complete.                           External and internal hemorrhoids were found. The  hemorrhoids were small.                           The exam was otherwise without abnormality on                            direct and retroflexion views. Complications:            No immediate complications. Estimated blood loss:                            None. Estimated Blood Loss:     Estimated blood loss: none. Impression:               - Three 1 to 2 mm polyps in the rectum, removed                            with a cold snare. Resected and retrieved.                           - External and internal hemorrhoids.                           - The examination was otherwise normal on direct                            and retroflexion views. Recommendation:           - Patient has a contact number available for                            emergencies. The signs and symptoms of potential                            delayed complications were discussed with the                             patient. Return to normal activities tomorrow.                            Written discharge instructions were provided to the                            patient.                           - Resume previous diet.                           - Continue present medications.                           - Await pathology results. Milus Banister, MD 09/01/2020 2:23:59 PM This report has been signed electronically.

## 2020-09-01 NOTE — Progress Notes (Signed)
Pt's states no medical or surgical changes since previsit or office visit. 

## 2020-09-01 NOTE — Patient Instructions (Signed)
Handout provided on Polyps and Hemorrhoids. Resume previous medications and diet. Await pathology results.   YOU HAD AN ENDOSCOPIC PROCEDURE TODAY AT [facility]:   Refer to the procedure report that was given to you for any specific questions about what was found during the examination.  If the procedure report does not answer your questions, please call your gastroenterologist to clarify.  If you requested that your care partner not be given the details of your procedure findings, then the procedure report has been included in a sealed envelope for you to review at your convenience later.  YOU SHOULD EXPECT: Some feelings of bloating in the abdomen. Passage of more gas than usual.  Walking can help get rid of the air that was put into your GI tract during the procedure and reduce the bloating. If you had a lower endoscopy (such as a colonoscopy or flexible sigmoidoscopy) you may notice spotting of blood in your stool or on the toilet paper. If you underwent a bowel prep for your procedure, you may not have a normal bowel movement for a few days.  Please Note:  You might notice some irritation and congestion in your nose or some drainage.  This is from the oxygen used during your procedure.  There is no need for concern and it should clear up in a day or so.  SYMPTOMS TO REPORT IMMEDIATELY:   Following lower endoscopy (colonoscopy or flexible sigmoidoscopy):  Excessive amounts of blood in the stool  Significant tenderness or worsening of abdominal pains  Swelling of the abdomen that is new, acute  Fever of 100F or higher    For urgent or emergent issues, a gastroenterologist can be reached at any hour by calling [phone]. Do not use MyChart messaging for urgent concerns.    DIET:  We do recommend a small meal at first, but then you may proceed to your regular diet.  Drink plenty of fluids but you should avoid alcoholic beverages for 24 hours.  ACTIVITY:  You should  plan to take it easy for the rest of today and you should NOT DRIVE or use heavy machinery until tomorrow (because of the sedation medicines used during the test).    FOLLOW UP: Our staff will call the number listed on your records 48-72 hours following your procedure to check on you and address any questions or concerns that you may have regarding the information given to you following your procedure. If we do not reach you, we will leave a message.  We will attempt to reach you two times.  During this call, we will ask if you have developed any symptoms of COVID 19. If you develop any symptoms (ie: fever, flu-like symptoms, shortness of breath, cough etc.) before then, please call [phone].  If you test positive for Covid 19 in the 2 weeks post procedure, please call and report this information to [place].    If any biopsies were taken you will be contacted by phone or by letter within the next 1-3 weeks.  Please call us at [phone] if you have not heard about the biopsies in 3 weeks.    SIGNATURES/CONFIDENTIALITY: You and/or your care partner have signed paperwork which will be entered into your electronic medical record.  These signatures attest to the fact that that the information above on your After Visit Summary has been reviewed and is understood.  Full responsibility of the confidentiality of this discharge information lies with you and/or your care-partner.

## 2020-09-03 ENCOUNTER — Telehealth: Payer: Self-pay

## 2020-09-03 NOTE — Telephone Encounter (Signed)
  Follow up Call-  Call back number 09/01/2020  Post procedure Call Back phone  # [phone]  Permission to leave phone message Yes  Some recent data might be hidden     Patient questions:  Do you have a fever, pain , or abdominal swelling? No. Pain Score  0 *  Have you tolerated food without any problems? Yes.    Have you been able to return to your normal activities? Yes.    Do you have any questions about your discharge instructions: Diet   No. Medications  No. Follow up visit  No.  Do you have questions or concerns about your Care? No.  Actions: * If pain score is 4 or above: 1. No action needed, pain <4.Have you developed a fever since your procedure? no  2.   Have you had an respiratory symptoms (SOB or cough) since your procedure? no  3.   Have you tested positive for COVID 19 since your procedure no  4.   Have you had any family members/close contacts diagnosed with the COVID 19 since your procedure?  no   If yes to any of these questions please route to Joylene John, RN and Joella Prince, RN

## 2020-09-03 NOTE — Telephone Encounter (Signed)
[facility] Patient Advocate Encounter  I called patient back several times to see which medication he was looking for but each time my call could not be completed as dialed.     Ileene Patrick, [facility] Patient [facility] for Infectious Disease Phone: [phone] Fax:  [phone]

## 2020-09-13 ENCOUNTER — Encounter: Payer: Self-pay | Admitting: Gastroenterology

## 2020-09-22 ENCOUNTER — Telehealth: Payer: Self-pay

## 2020-09-22 ENCOUNTER — Ambulatory Visit (INDEPENDENT_AMBULATORY_CARE_PROVIDER_SITE_OTHER): Payer: 59 | Admitting: Gastroenterology

## 2020-09-22 DIAGNOSIS — Z23 Encounter for immunization: Secondary | ICD-10-CM | POA: Diagnosis not present

## 2020-09-22 NOTE — Telephone Encounter (Signed)
The pt has been advised that he should keep appts as planned with ID.  The pt has been advised of the information and verbalized understanding.

## 2020-09-22 NOTE — Telephone Encounter (Signed)
-----   Message from Roetta Sessions, [place] sent at 09/22/2020  8:59 AM EST ----- Regarding: Hep C ? Pt came today for Twinrix injection and indicated Dr. Ardis Hughs said at his colonoscopy that he had Hep C and needed treatment.  Please call pt to discuss.  Thank you. Jan

## 2020-09-29 ENCOUNTER — Encounter: Payer: Self-pay | Admitting: Internal Medicine

## 2020-09-29 ENCOUNTER — Telehealth: Payer: Self-pay

## 2020-09-29 ENCOUNTER — Other Ambulatory Visit: Payer: Self-pay

## 2020-09-29 ENCOUNTER — Ambulatory Visit: Payer: 59 | Admitting: Internal Medicine

## 2020-09-29 VITALS — BP 120/82 | HR 91 | Temp 97.4°F | Resp 16 | Ht 69.0 in | Wt 184.0 lb

## 2020-09-29 DIAGNOSIS — B182 Chronic viral hepatitis C: Secondary | ICD-10-CM | POA: Diagnosis not present

## 2020-09-29 NOTE — Telephone Encounter (Signed)
[facility] Patient Advocate Encounter   Received notification from [facility] that prior authorization for MAVYRET is required.   PA submitted on 09/29/20 Key [id number] Status is pending    [facility] will continue to follow.   Ileene Patrick, [facility] Patient [facility] for Infectious Disease Phone: [phone] Fax:  [phone]

## 2020-09-29 NOTE — Progress Notes (Signed)
[facility] for Infectious Disease  Reason for Consult:hep c     Patient Active Problem List   Diagnosis Date Noted  . History of colonic polyps 07/29/2020  . Newly diagnosed diabetes ([place]) 07/10/2017  . Elevated liver enzymes 09/19/2011  . Hyperlipidemia 09/19/2011  . Depression 09/19/2011  . Erectile dysfunction 09/19/2011    Patient's Medications  New Prescriptions   No medications on file  Previous Medications   ATORVASTATIN (LIPITOR) 40 MG TABLET    Take 40 mg by mouth daily.   B COMPLEX VITAMINS TABLET    Take 1 tablet by mouth daily.    BUSPIRONE (BUSPAR) 15 MG TABLET    buspirone 15 mg tablet  TK 2 TS PO BID-30 mg total   GLUCOSE BLOOD TEST STRIP    OneTouch Verio test strips  USE TO CHECK BLLOD SUGARS TWICE DAILY AS DIRECTED   METFORMIN (GLUCOPHAGE) 500 MG TABLET    Take 500 mg by mouth 2 (two) times daily with a meal.   MIRTAZAPINE (REMERON) 30 MG TABLET    Take 30 mg by mouth at bedtime.   NORTRIPTYLINE (PAMELOR) 50 MG CAPSULE    nortriptyline 50 mg capsule  Take 1 capsule twice a day by oral route as directed.   OVER THE COUNTER MEDICATION    daily. Tumeric powder mixed in ACV and water daily  Modified Medications   No medications on file  Discontinued Medications   ATORVASTATIN (LIPITOR) 20 MG TABLET    Take 20 mg by mouth daily.   MUPIROCIN OINTMENT (BACTROBAN) 2 %    mupirocin 2 % topical ointment   NORTRIPTYLINE (PAMELOR) 25 MG CAPSULE    TK ONE C PO BID    HPI: Aarin Bluett is a 54 y.o. 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 Patient been married 15 years.   Quit smoking  Reviewed labs No s/s hep c complication extraintestinal No s/s cirrhosis  Discussed transmission risk and need to not share personal hygiene equipment Discussed avoiding nephrotoxin, raw sea food Discussed healthy lifestyle living   Lab Results  Component Value Date   PLT 234 08/19/2020    Review of Systems: ROS  Negative 11 point ros unless mentioned above  Past Medical History:  Diagnosis Date  . Allergy   . Anxiety   . Depression   . Diabetes mellitus without complication ([place])   . Elevated LFTs   . GERD (gastroesophageal reflux disease)   . Hyperlipidemia   . Tobacco use disorder   . Tubular adenoma of colon     Social History   Tobacco Use  . Smoking status: Former Smoker    Packs/day: 1.00    Years: 25.00    Pack years: 25.00    Types: Cigarettes    Quit date: 06/19/2011    Years since quitting: 9.2  . Smokeless tobacco: Former Network engineer Use Topics  .  Alcohol use: Yes    Alcohol/week: 0.0 standard drinks    Comment: socially  . Drug use: Yes    Frequency: 7.0 times per week    Types: Marijuana    Comment: daily    Family History  Problem Relation Age of Onset  . Hyperlipidemia Mother   . Hypertension Mother   . Heart disease Mother 55       MI  . Colon polyps Mother   . Cancer Father        bladder  . Diabetes Father   . Alcohol abuse Father   . Cancer Maternal Grandmother        lung cancer  . Heart disease Maternal Grandfather   . Diabetes Paternal Grandfather   . Stroke Neg Hx   . Colon cancer Neg Hx   . Esophageal cancer Neg Hx   . Rectal cancer Neg Hx   . Stomach cancer Neg Hx    Allergies  Allergen Reactions  . Chantix [Varenicline Tartrate]     Made him angry    OBJECTIVE: Vitals:   09/29/20 0951  BP: 120/82  Pulse: 91  Resp: 16  Temp: (!)  97.4 F (36.3 C)  SpO2: 97%  Weight: 184 lb (83.5 kg)  Height: 5\' 9"  (1.753 m)   Body mass index is 27.17 kg/m.   Physical Exam  No distress, thin male, pleasant Heent: Nomrocephalic, per, conj clear; eomi Neck supple cv rrr no mrg Lungs clear abd s/nt Ext no edema Skin no rash Neuro cn2-12 intact, strength reflex symmetric, normal gait, no tremor Psych alert/oriented msk no cva tenderness   Lab: Reviewed 07/2020 fib 4 1.28  Microbiology: No results found for this or any previous visit (from the past 240 hour(s)).  Serology: 10/20 hep c gt 1a; 4.83million rna pcr hiv negative Hep a/b negative  Imaging: 11/03 liver elastography kPA 3.4 (f0-f1 metavir stage) 10/26 liver u/s cholelithiasis and gall bladder polyps  Assessment/plan:    Patient Active Problem List   Diagnosis Date Noted  . History of colonic polyps 07/29/2020  . Newly diagnosed diabetes ([place]) 07/10/2017  . Elevated liver enzymes 09/19/2011  . Hyperlipidemia 09/19/2011  . Depression 09/19/2011  . Erectile dysfunction 09/19/2011     Problem List Items Addressed This Visit    None      #chronic hep c per history/labs.  fib4 score and story also consistent with no evidence advance fibrosis tx naieve Liver elastography also suggest no evidence advance fibrosis  -plan for 8 weeks mavyret -stop lipitor 1 week prior to starting [place] will be working on preauthorization/insurance approval and schedule him 1 month f/u while on treatment  #other hepatitis Patient is seronegative for hep a and b; had received 2 shots of 3 series for twinrix -f/u nurse visit 4 months from last twinrix shot   -discussed natural progression of hep c, transmission (avoid sharing personal hygiene equipment) -discussed avoid toxin like etoh and excessive acetamaminphen (no more than 2 gram a day) -discussed healthy life style and good glucose control -discussed avoiding eating raw sea food -discussed we  can treat hep c but can be reinfected -discussed hepatitis coinfection and vaccination   I have discontinued Charlena Cross "Brian"'s mupirocin ointment. I am also having him maintain his b complex vitamins, busPIRone, metFORMIN, OVER THE COUNTER MEDICATION, mirtazapine, glucose blood, atorvastatin, and nortriptyline. We will continue to administer sodium chloride.   No orders of the defined types were placed in this encounter.  Follow-up: No follow-ups on file.  Jabier Mutton, [facility] [place] -- -- pager   [phone] cell 09/29/2020, 10:10 AM

## 2020-09-29 NOTE — Patient Instructions (Signed)
Your liver ultrasound, labs, and history all suggest at this time your liver is not too inflammed or scarred up  mavyret of 8 weeks will be prescribed for your hepatitis C It comes in 3 tiny pills packets. Take all 3 pills once a day for 2 months  Check blood test once a month while you are on medication, then a viral load will be checked 12 weeks after you finished your mediation  Please anticipate mild headache or fatigue with the mavyret. If severe symptoms on medications, please call us   Please stop atorvastatin one week prior to starting mavyret, and if you are prescribed any new medication please let me know as they can interact with your mavyret

## 2020-10-13 ENCOUNTER — Telehealth: Payer: Self-pay

## 2020-10-13 ENCOUNTER — Other Ambulatory Visit: Payer: Self-pay | Admitting: Pharmacist

## 2020-10-13 DIAGNOSIS — B182 Chronic viral hepatitis C: Secondary | ICD-10-CM

## 2020-10-13 MED ORDER — MAVYRET 100-40 MG PO TABS: 3.0000 | ORAL_TABLET | Freq: Every day | ORAL | 1 refills | Status: DC

## 2020-10-13 NOTE — Progress Notes (Signed)
Patient's insurance requires Mavyret to be filled at [facility]. Resending Rx now. Butch Penny will call patient and coordinate.

## 2020-10-13 NOTE — Telephone Encounter (Addendum)
[facility] Patient Advocate Encounter  Prior Authorization for MAVYRET has been approved.    PA# [id number] Effective dates: 10/13/20 through 12/08/20  Prescription need to be filled with [facility].  Co-Pay $250.00 I was able to get a Co-Pay Coupon Card to make his Co-Pay $5.00.  BIN: [id number] PCN: [id number]  GRP: [id number] ID: [id number] I gave information to [place] .  [facility] will continue to follow.  Ileene Patrick, [facility] Patient [facility] for [facility]ious Disease Phone: [phone] Fax:  [phone]

## 2020-11-15 ENCOUNTER — Telehealth: Payer: Self-pay

## 2020-11-15 DIAGNOSIS — B182 Chronic viral hepatitis C: Secondary | ICD-10-CM

## 2020-11-15 NOTE — Telephone Encounter (Signed)
Received call from patient, he will run out of his [place] this Thursday. He has switched insurance and now needs [facility]. They cannot send out a refill until this Thursday. The patient is very worried about lapsing and missing a dose. Will route to pharmacy team for follow-up.   Beryle Flock, RN

## 2020-11-16 MED ORDER — MAVYRET 100-40 MG PO TABS: 3.0000 | ORAL_TABLET | Freq: Every day | ORAL | 0 refills | Status: DC

## 2020-11-16 NOTE — Telephone Encounter (Signed)
Patient here as a walk-in with concerns he will run out of Mavyret on Thursday due to a delay with change in insurance. Pharmacy staff provided patient with his last 4 weeks of [place].   RN sent patient's refill to [facility] with the request that it be sent to the clinic to replace the clinic supply that was provided to the patient. RN instructed patient to request that it be delivered to our clinic if [facility] calls [facility]. Patient verbalized understanding and has no further questions.   Beryle Flock, RN

## 2020-11-16 NOTE — Addendum Note (Signed)
Addended by: Lucie Leather D on: 11/16/2020 09:26 AM   Modules accepted: Orders

## 2020-11-16 NOTE — Telephone Encounter (Signed)
[name],  [facility] in [place].

## 2020-11-16 NOTE — Telephone Encounter (Signed)
RN sent M[facility]ret to [facility] in [place] with instructions to send the medication to the clinic.   Beryle Flock, RN

## 2020-11-16 NOTE — Addendum Note (Signed)
Addended by: Lucie Leather D on: 11/16/2020 08:54 AM   Modules accepted: Orders

## 2020-11-17 ENCOUNTER — Telehealth: Payer: Self-pay

## 2020-11-17 NOTE — Telephone Encounter (Signed)
[facility] Patient Advocate Encounter  Patient came in 11/16/20 letting us know he have new insurance and his [place] [facility]. He wanted to make sure he will not run out of his medication.  I called [facility] and gave all the insurance information with a rep and they will let me know if the patient has a co-pay or will need a PA.  I also informed [facility]s to the clinic.      Ileene Patrick, [facility] Patient [facility] for Infectious Disease Phone: [phone] Fax:  [phone]

## 2020-11-19 ENCOUNTER — Encounter: Payer: Self-pay | Admitting: Internal Medicine

## 2020-11-19 ENCOUNTER — Other Ambulatory Visit: Payer: Self-pay

## 2020-11-19 ENCOUNTER — Ambulatory Visit (INDEPENDENT_AMBULATORY_CARE_PROVIDER_SITE_OTHER): Payer: BLUE CROSS/BLUE SHIELD | Admitting: Internal Medicine

## 2020-11-19 VITALS — BP 121/80 | HR 92 | Temp 98.0°F | Wt 181.0 lb

## 2020-11-19 DIAGNOSIS — B182 Chronic viral hepatitis C: Secondary | ICD-10-CM

## 2020-11-19 LAB — CBC
HCT: 44.9 % (ref 38.5–50.0)
Hemoglobin: 15.8 g/dL (ref 13.2–17.1)
MCH: 32.6 pg (ref 27.0–33.0)
MCHC: 35.2 g/dL (ref 32.0–36.0)
MCV: 92.8 fL (ref 80.0–100.0)
MPV: 10.1 fL (ref 7.5–12.5)
Platelets: 275 10*3/uL (ref 140–400)
RBC: 4.84 10*6/uL (ref 4.20–5.80)
RDW: 12.5 % (ref 11.0–15.0)
WBC: 8.7 10*3/uL (ref 3.8–10.8)

## 2020-11-19 NOTE — Progress Notes (Signed)
[facility] for [facility] Disease  Reason for Consult:hep c     Patient Active Problem List   Diagnosis Date Noted  . History of colonic polyps 07/29/2020  . Newly diagnosed diabetes ([place]) 07/10/2017  . Elevated liver enzymes 09/19/2011  . Hyperlipidemia 09/19/2011  . Depression 09/19/2011  . Erectile dysfunction 09/19/2011    Patient's Medications  New Prescriptions   No medications on file  Previous Medications   ATORVASTATIN (LIPITOR) 40 MG TABLET    Take 40 mg by mouth daily.   B COMPLEX VITAMINS TABLET    Take 1 tablet by mouth daily.    BUSPIRONE (BUSPAR) 15 MG TABLET    buspirone 15 mg tablet  TK 2 TS PO BID-30 mg total   GLECAPREVIR-PIBRENTASVIR (MAVYRET) 100-40 MG TABS    Take 3 tablets by mouth daily with breakfast.   GLUCOSE BLOOD TEST STRIP    OneTouch Verio test strips  USE TO CHECK BLLOD SUGARS TWICE DAILY AS DIRECTED   METFORMIN (GLUCOPHAGE) 500 MG TABLET    Take 500 mg by mouth 2 (two) times daily with a meal.   MIRTAZAPINE (REMERON) 30 MG TABLET    Take 30 mg by mouth at bedtime.   NORTRIPTYLINE (PAMELOR) 50 MG CAPSULE    nortriptyline 50 mg capsule  Take 1 capsule twice a day by oral route as directed.   OVER THE COUNTER MEDICATION    daily. Tumeric powder mixed in ACV and water daily  Modified Medications   No medications on file  Discontinued Medications   No medications on file    HPI: David Dyer is a 55 y.o. male tobacco use, dm2 (not on insulin), gerd, HLP, here for ongoing hep c management.  11/19/20 id f/u Patient is on planned 8 weeks of mavyret treatment; he is into week 5 He has no issue with the medication. He hasn't missed any dose the past 4 weeks He has insurance change and his 2nd batch is not yet rx'ed but our clinic has some spare (4 week supply) which was given to him Initial week with headache and fatigue but that all resolved No n/v/diarrhea, rash Appetite great Has hold his lipitor No new meds  12/08 id  f/u Gt 1a hep c Liver elastography f0/f1 metavir stage No new history since previously seen  Background: Patient doesn't take any acid blocker any more for >10 years He does take 40 mg atorvastatin  He was never told having hepatitis c before until just prior to this visit:   Risk factors:  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 Patient been married 15 years.   Quit smoking  Reviewed labs No s/s hep c complication extraintestinal No s/s cirrhosis  Discussed transmission risk and need to not share personal hygiene equipment Discussed avoiding nephrotoxin, raw sea food Discussed healthy lifestyle living   Review of Systems: ROS Negative other ros  Past Medical History:  Diagnosis Date  . Allergy   . Anxiety   . Depression   . Diabetes mellitus without complication ([place])   . Elevated LFTs   . GERD (gastroesophageal reflux disease)   . Hyperlipidemia   . Tobacco use disorder   . Tubular adenoma of colon     Social History   Tobacco Use  . Smoking status: Former  Smoker    Packs/day: 1.00    Years: 25.00    Pack years: 25.00    Types: Cigarettes    Quit date: 06/19/2011    Years since quitting: 9.4  . Smokeless tobacco: Former Network engineer Use Topics  . Alcohol use: Yes    Alcohol/week: 0.0 standard drinks    Comment: socially  . Drug use: Yes    Frequency: 7.0 times per week    Types: Marijuana    Comment: daily    Family History  Problem Relation Age of Onset  . Hyperlipidemia Mother   . Hypertension Mother   . Heart disease Mother 57       MI  . Colon polyps Mother   . Cancer Father        bladder  . Diabetes Father   . Alcohol abuse Father   . Cancer Maternal Grandmother        lung cancer  . Heart disease Maternal Grandfather   . Diabetes Paternal  Grandfather   . Stroke Neg Hx   . Colon cancer Neg Hx   . Esophageal cancer Neg Hx   . Rectal cancer Neg Hx   . Stomach cancer Neg Hx    Allergies  Allergen Reactions  . Chantix [Varenicline Tartrate]     Made him angry    OBJECTIVE: There were no vitals filed for this visit. There is no height or weight on file to calculate BMI.   Physical Exam  No distress, thin male, pleasant Heent: atraumtaic, conj clear Neck supple cv rrr no mrg Lungs clear abd s/nt Ext no edema Skin no rash Neuro nonfocal Psych alert/oriented    Lab: Reviewed 07/2020 fib 4 1.28  Microbiology: No results found for this or any previous visit (from the past 240 hour(s)).  Serology: 10/20 hep c gt 1a; 4.50million rna pcr hiv negative Hep a/b negative  Imaging: 11/03 liver elastography kPA 3.4 (f0-f1 metavir stage) 10/26 liver u/s cholelithiasis and gall bladder polyps  Assessment/plan:    Patient Active Problem List   Diagnosis Date Noted  . History of colonic polyps 07/29/2020  . Newly diagnosed diabetes ([place]) 07/10/2017  . Elevated liver enzymes 09/19/2011  . Hyperlipidemia 09/19/2011  . Depression 09/19/2011  . Erectile dysfunction 09/19/2011     Problem List Items Addressed This Visit   None   Visit Diagnoses    Chronic hepatitis C without hepatic coma ([place])    -  Primary   Relevant Orders   CBC   Comprehensive metabolic panel      #chronic hep c per history/labs.  fib4 score and story also consistent with no evidence advance fibrosis tx naive Liver elastography also suggest no evidence advance fibrosis Planned 8 weeks mavyret; patient is on the first dose of his 5th week medication   -continue to hold lipitor; discuss any new meds with [place]; avoid acid blocker -labs cbc, cmp today to assess for drug toxicity -f/u 1 month video/phone visit  #other hepatitis Patient is seronegative for hep a and b; had received 2 shots of 3 series for twinrix -f/u nurse visit 2  months from last twinrix shot   -discussed natural progression of hep c, transmission (avoid sharing personal hygiene equipment) -discussed avoid toxin like etoh and excessive acetamaminphen (no more than 2 gram a day) -discussed healthy life style and good glucose control -discussed avoiding eating raw sea food -discussed we can treat hep c but can be reinfected -discussed hepatitis coinfection and vaccination  Follow-up: Return in about 4 weeks (around 12/17/2020).  Jabier Mutton, [facility] Infect[facility] Disease [place] -- -- pager   [phone] cell 11/19/2020, 1:32 PM

## 2020-11-19 NOTE — Patient Instructions (Addendum)
You are doing a great job with the [employer] another 4 weeks for total 8 weeks medication   Will get labs today, and see you in 1 month   The next time we'll check labs is 4 months from now.   Continue to be wary of what new medications are given to you and let us know to we can make sure the Delfino Lovett is still ok. Continue to hold your cholesterol medication (lipitor or atorvastatin). Avoid acid blocker as well while you are taking mavyret

## 2020-12-17 ENCOUNTER — Telehealth (INDEPENDENT_AMBULATORY_CARE_PROVIDER_SITE_OTHER): Payer: BLUE CROSS/BLUE SHIELD | Admitting: Internal Medicine

## 2020-12-17 ENCOUNTER — Other Ambulatory Visit: Payer: Self-pay

## 2020-12-17 DIAGNOSIS — B182 Chronic viral hepatitis C: Secondary | ICD-10-CM | POA: Diagnosis not present

## 2020-12-17 NOTE — Progress Notes (Signed)
[facility] for Infectious Disease  Reason for Consult:hep c     Patient Active Problem List   Diagnosis Date Noted  . History of colonic polyps 07/29/2020  . Newly diagnosed diabetes ([place]) 07/10/2017  . Elevated liver enzymes 09/19/2011  . Hyperlipidemia 09/19/2011  . Depression 09/19/2011  . Erectile dysfunction 09/19/2011    Patient's Medications  New Prescriptions   No medications on file  Previous Medications   ATORVASTATIN (LIPITOR) 40 MG TABLET    Take 40 mg by mouth daily.   B COMPLEX VITAMINS TABLET    Take 1 tablet by mouth daily.    BUSPIRONE (BUSPAR) 15 MG TABLET    buspirone 15 mg tablet  TK 2 TS PO BID-30 mg total   GLECAPREVIR-PIBRENTASVIR (MAVYRET) 100-40 MG TABS    Take 3 tablets by mouth daily with breakfast.   GLUCOSE BLOOD TEST STRIP    OneTouch Verio test strips  USE TO CHECK BLLOD SUGARS TWICE DAILY AS DIRECTED   METFORMIN (GLUCOPHAGE) 500 MG TABLET    Take 500 mg by mouth 2 (two) times daily with a meal.   MIRTAZAPINE (REMERON) 30 MG TABLET    Take 30 mg by mouth at bedtime.   NORTRIPTYLINE (PAMELOR) 50 MG CAPSULE    nortriptyline 50 mg capsule  Take 1 capsule twice a day by oral route as directed.   OVER THE COUNTER MEDICATION    daily. Tumeric powder mixed in ACV and water daily  Modified Medications   No medications on file  Discontinued Medications   No medications on file    HPI: David Dyer is a 55 y.o. male tobacco use, dm2 (not on insulin), gerd, HLP, here for ongoing hep c management.  12/17/20 id f/u I connected via video visit with patient I verified that I was speaking with the correct person using two identifiers. Due to the COVID-19 Pandemic, this service was provided via telemedicine using audio/visual media.   The patient was located at home. The provider was located in the office. The patient did consent to this visit and is aware of charges through their insurance as well as the limitations of evaluation and  management by telemedicine. Other persons participating in this telemedicine service were none.   No issue, no missed dose, feeling well No n/v/diarrhea/rash Initial fatigue resolved   11/19/20 id f/u Patient is on planned 8 weeks of mavyret treatment; he is into week 5 He has no issue with the medication. He hasn't missed any dose the past 4 weeks He has insurance change and his 2nd batch is not yet rx'ed but our clinic has some spare (4 week supply) which was given to him Initial week with headache and fatigue but that all resolved No n/v/diarrhea, rash Appetite great Has hold his lipitor No new meds  12/08 id f/u Gt 1a hep c Liver elastography f0/f1 metavir stage No new history since previously seen  Background: Patient doesn't take any acid blocker any more for >10 years He does take 40 mg atorvastatin  He was never told having hepatitis c before until just prior to this visit:   Risk factors:  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 Patient been married 15 years.   Quit smoking  Reviewed labs No s/s hep c complication extraintestinal No  s/s cirrhosis  Discussed transmission risk and need to not share personal hygiene equipment Discussed avoiding nephrotoxin, raw sea food Discussed healthy lifestyle living   Review of Systems: ROS Negative other ros  Past Medical History:  Diagnosis Date  . Allergy   . Anxiety   . Depression   . Diabetes mellitus without complication ([place])   . Elevated LFTs   . GERD (gastroesophageal reflux disease)   . Hyperlipidemia   . Tobacco use disorder   . Tubular adenoma of colon     Social History   Tobacco Use  . Smoking status: Former Smoker    Packs/day: 1.00    Years: 25.00    Pack years: 25.00    Types: Cigarettes    Quit date:  06/19/2011    Years since quitting: 9.5  . Smokeless tobacco: Former Network engineer Use Topics  . Alcohol use: Yes    Alcohol/week: 0.0 standard drinks    Comment: socially  . Drug use: Yes    Frequency: 7.0 times per week    Types: Marijuana    Comment: daily    Family History  Problem Relation Age of Onset  . Hyperlipidemia Mother   . Hypertension Mother   . Heart disease Mother 31       MI  . Colon polyps Mother   . Cancer Father        bladder  . Diabetes Father   . Alcohol abuse Father   . Cancer Maternal Grandmother        lung cancer  . Heart disease Maternal Grandfather   . Diabetes Paternal Grandfather   . Stroke Neg Hx   . Colon cancer Neg Hx   . Esophageal cancer Neg Hx   . Rectal cancer Neg Hx   . Stomach cancer Neg Hx    Allergies  Allergen Reactions  . Chantix [Varenicline Tartrate]     Made him angry    OBJECTIVE: There were no vitals filed for this visit. There is no height or weight on file to calculate BMI.   Physical Exam  This is a video visit Well appearing, conversant, no distress Atraumatic, eomi Normal respiratory effort   Lab: Reviewed 11/19/20 cbc 9/15/275; no cmp done 07/2020 fib 4 1.28  Microbiology: No results found for this or any previous visit (from the past 240 hour(s)).  Serology: 10/20 hep c gt 1a; 4.24million rna pcr hiv negative Hep a/b negative  Imaging: 11/03 liver elastography kPA 3.4 (f0-f1 metavir stage) 10/26 liver u/s cholelithiasis and gall bladder polyps  Assessment/plan:    Patient Active Problem List   Diagnosis Date Noted  . History of colonic polyps 07/29/2020  . Newly diagnosed diabetes ([place]) 07/10/2017  . Elevated liver enzymes 09/19/2011  . Hyperlipidemia 09/19/2011  . Depression 09/19/2011  . Erectile dysfunction 09/19/2011     Problem List Items Addressed This Visit   None     #chronic hep c per history/labs.  fib4 score and story also consistent with no evidence advance  fibrosis tx naive Liver elastography also suggest no evidence advance fibrosis Planned 8 weeks mavyret; patient finished today  -continue to hold lipitor; discuss any new meds with [place]; avoid acid blocker -labs cbc, cmp, hep c rna in 3 months and no earlier (last week may) -f/u 1 week after labs (first week June)  #other hepatitis Patient is seronegative for hep a and b; had started hep b vaccination -will review and make sure he have full heplisave vaccination   -  discussed natural progression of hep c, transmission (avoid sharing personal hygiene equipment) -discussed avoid toxin like etoh and excessive acetamaminphen (no more than 2 gram a day) -discussed healthy life style and good glucose control -discussed avoiding eating raw sea food -discussed we can treat hep c but can be reinfected -discussed hepatitis coinfection and vaccination    Follow-up: No follow-ups on file.  Jabier Mutton, [facility] Disease [place] -- -- pager   [phone] cell 12/17/2020, 8:57 AM

## 2020-12-23 ENCOUNTER — Other Ambulatory Visit: Payer: Self-pay | Admitting: Orthopedic Surgery

## 2020-12-23 DIAGNOSIS — R2231 Localized swelling, mass and lump, right upper limb: Secondary | ICD-10-CM

## 2021-01-14 ENCOUNTER — Ambulatory Visit
Admission: RE | Admit: 2021-01-14 | Discharge: 2021-01-14 | Disposition: A | Discharge status: Home / Self Care | Payer: BLUE CROSS/BLUE SHIELD | Source: Ambulatory Visit | Attending: Orthopedic Surgery | Admitting: Orthopedic Surgery

## 2021-01-14 DIAGNOSIS — R2231 Localized swelling, mass and lump, right upper limb: Secondary | ICD-10-CM

## 2021-02-18 ENCOUNTER — Ambulatory Visit (INDEPENDENT_AMBULATORY_CARE_PROVIDER_SITE_OTHER): Payer: BLUE CROSS/BLUE SHIELD | Admitting: Gastroenterology

## 2021-02-18 DIAGNOSIS — Z23 Encounter for immunization: Secondary | ICD-10-CM | POA: Diagnosis not present

## 2021-04-06 ENCOUNTER — Other Ambulatory Visit: Payer: Self-pay

## 2021-04-06 ENCOUNTER — Ambulatory Visit: Payer: BLUE CROSS/BLUE SHIELD | Admitting: Pharmacist

## 2021-04-06 DIAGNOSIS — B182 Chronic viral hepatitis C: Secondary | ICD-10-CM | POA: Diagnosis not present

## 2021-04-06 NOTE — Progress Notes (Signed)
HPI: Arturo Sofranko is a 55 y.o. male who presents to the [facility] clinic for Hepatitis C follow-up.  Medication: Mavyret x 8 weeks  Start Date: 10/22/20 End Date: 12/17/20  Hepatitis C Genotype: 1a  Fibrosis Score: F0-F1  Hepatitis C RNA: 4,120,000 (07/29/20)  Patient Active Problem List   Diagnosis Date Noted   History of colonic polyps 07/29/2020   Newly diagnosed diabetes ([place]) 07/10/2017   Elevated liver enzymes 09/19/2011   Hyperlipidemia 09/19/2011   Depression 09/19/2011   Erectile dysfunction 09/19/2011    Patient's Medications  New Prescriptions   No medications on file  Previous Medications   ATORVASTATIN (LIPITOR) 40 MG TABLET    Take 40 mg by mouth daily.   B COMPLEX VITAMINS TABLET    Take 1 tablet by mouth daily.    BUSPIRONE (BUSPAR) 15 MG TABLET    buspirone 15 mg tablet  TK 2 TS PO BID-30 mg total   GLECAPREVIR-PIBRENTASVIR (MAVYRET) 100-40 MG TABS    Take 3 tablets by mouth daily with breakfast.   GLUCOSE BLOOD TEST STRIP    OneTouch Verio test strips  USE TO CHECK BLLOD SUGARS TWICE DAILY AS DIRECTED   METFORMIN (GLUCOPHAGE) 500 MG TABLET    Take 500 mg by mouth 2 (two) times daily with a meal.   MIRTAZAPINE (REMERON) 30 MG TABLET    Take 30 mg by mouth at bedtime.   NORTRIPTYLINE (PAMELOR) 50 MG CAPSULE    nortriptyline 50 mg capsule  Take 1 capsule twice a day by oral route as directed.   OVER THE COUNTER MEDICATION    daily. Tumeric powder mixed in ACV and water daily  Modified Medications   No medications on file  Discontinued Medications   No medications on file    Allergies: Allergies  Allergen Reactions   Chantix [Varenicline Tartrate]     Made him angry    Past Medical History: Past Medical History:  Diagnosis Date   Allergy    Anxiety    Depression    Diabetes mellitus without complication ([facility])    Elevated LFTs    GERD (gastroesophageal reflux disease)    Hyperlipidemia    Tobacco use disorder    Tubular adenoma of colon      Social History: Social History   Socioeconomic History   Marital status: Married    Spouse name: Not on file   Number of children: Not on file   Years of education: Not on file   Highest education level: Not on file  Occupational History   Not on file  Tobacco Use   Smoking status: Former    Packs/day: 1.00    Years: 25.00    Pack years: 25.00    Types: Cigarettes    Quit date: 06/19/2011    Years since quitting: 9.8   Smokeless tobacco: Former  Substance and Sexual Activity   Alcohol use: Yes    Alcohol/week: 0.0 standard drinks    Comment: socially   Drug use: Yes    Frequency: 7.0 times per week    Types: Marijuana    Comment: daily   Sexual activity: Not on file    Comment: married, 1 daughter, unemployed.  Glass technician, Cabin crew  Other Topics Concern   Not on file  Social History Narrative   Not on file   Social Determinants of Health   Financial Resource Strain: Not on file  Food Insecurity: Not on file  Transportation Needs: Not on file  Physical  Activity: Not on file  Stress: Not on file  Social Connections: Not on file    Labs: Hepatitis C Lab Results  Component Value Date   HCVGENOTYPE 1a 08/11/2020   HEPCAB REACTIVE (A) 07/29/2020   HCVRNAPCRQN 4,120,000 (H) 07/29/2020   Hepatitis B Lab Results  Component Value Date   HEPBSAB NON-REACTIVE 07/29/2020   HEPBSAG NON-REACTIVE 07/29/2020   HEPBCAB NON-REACTIVE 07/29/2020   Hepatitis A Lab Results  Component Value Date   HAV NON-REACTIVE 08/11/2020   HIV Lab Results  Component Value Date   HIV NON-REACTIVE 08/11/2020   Lab Results  Component Value Date   CREATININE 0.97 08/19/2020   CREATININE 0.86 07/29/2020   CREATININE 0.89 06/07/2011   Lab Results  Component Value Date   AST 47 (H) 08/19/2020   AST 65 (H) 07/29/2020   AST 33 09/19/2011   ALT 117 (H) 08/19/2020   ALT 163 (H) 07/29/2020   ALT 66 (H) 01/01/2012   INR 1.0 08/19/2020    Assessment: Aaron Edelman  presents for his SVR12 visit after completing 8 weeks of [place] for hepatitis C (10/22/20-12/17/20). Denies any missed doses, took Mavyret every day at 4am.   He has completed 3 doses of Twinrix with last dose 02/18/21. Will check hepatitis B surface antibody today to confirm immunity.    Plan: HCV RNA, CMET, CBC, hepatitis B surface antibody  Rebbeca Paul, PharmD PGY1 Pharmacy Resident 04/06/2021 9:32 AM

## 2021-04-08 LAB — COMPREHENSIVE METABOLIC PANEL
AG Ratio: 1.8 (calc) (ref 1.0–2.5)
ALT: 40 U/L (ref 9–46)
AST: 27 U/L (ref 10–35)
Albumin: 4.9 g/dL (ref 3.6–5.1)
Alkaline phosphatase (APISO): 43 U/L (ref 35–144)
BUN: 16 mg/dL (ref 7–25)
CO2: 28 mmol/L (ref 20–32)
Calcium: 9.7 mg/dL (ref 8.6–10.3)
Chloride: 103 mmol/L (ref 98–110)
Creat: 0.93 mg/dL (ref 0.70–1.33)
Globulin: 2.8 g/dL (calc) (ref 1.9–3.7)
Glucose, Bld: 134 mg/dL — ABNORMAL HIGH (ref 65–99)
Potassium: 4.7 mmol/L (ref 3.5–5.3)
Sodium: 139 mmol/L (ref 135–146)
Total Bilirubin: 0.4 mg/dL (ref 0.2–1.2)
Total Protein: 7.7 g/dL (ref 6.1–8.1)

## 2021-04-08 LAB — CBC
HCT: 45.2 % (ref 38.5–50.0)
Hemoglobin: 15.6 g/dL (ref 13.2–17.1)
MCH: 31.2 pg (ref 27.0–33.0)
MCHC: 34.5 g/dL (ref 32.0–36.0)
MCV: 90.4 fL (ref 80.0–100.0)
MPV: 9.8 fL (ref 7.5–12.5)
Platelets: 246 10*3/uL (ref 140–400)
RBC: 5 10*6/uL (ref 4.20–5.80)
RDW: 12.6 % (ref 11.0–15.0)
WBC: 8.5 10*3/uL (ref 3.8–10.8)

## 2021-04-08 LAB — HEPATITIS C RNA QUANTITATIVE
HCV Quantitative Log: <1.18 log IU/mL
HCV RNA, PCR, QN: <15 IU/mL

## 2021-04-08 LAB — HEPATITIS B SURFACE ANTIBODY,QUALITATIVE: Hep B S Ab: REACTIVE — AB

## 2021-04-11 ENCOUNTER — Telehealth: Payer: Self-pay | Admitting: Student-PharmD

## 2021-04-11 NOTE — Telephone Encounter (Signed)
Thank you :)

## 2021-04-11 NOTE — Telephone Encounter (Signed)
Called David Dyer to let him know that his HCV RNA is undetectable, representing virologic cure. His hepatitis B surface antibody is reactive, indicating immunity. He was very glad to hear this news and states his appreciation for the care he received at [facility] throughout his treatment.   Rebbeca Paul, PharmD PGY1 Pharmacy Resident 04/11/2021 10:10 AM

## 2022-04-10 ENCOUNTER — Other Ambulatory Visit: Payer: Self-pay | Admitting: *Deleted

## 2022-04-10 DIAGNOSIS — Z122 Encounter for screening for malignant neoplasm of respiratory organs: Secondary | ICD-10-CM

## 2022-04-28 ENCOUNTER — Ambulatory Visit
Admission: RE | Admit: 2022-04-28 | Discharge: 2022-04-28 | Disposition: A | Discharge status: Home / Self Care | Payer: BLUE CROSS/BLUE SHIELD | Source: Ambulatory Visit | Attending: *Deleted | Admitting: *Deleted

## 2022-04-28 DIAGNOSIS — Z122 Encounter for screening for malignant neoplasm of respiratory organs: Secondary | ICD-10-CM

## 2022-06-12 IMAGING — US US ABDOMEN LIMITED
2 series · 14 of 25 positions shown · non-contrast
Comparison: None.

CLINICAL DATA: Elevated LFTs

EXAM:
ULTRASOUND ABDOMEN LIMITED RIGHT UPPER QUADRANT

[Series 1: us abdomen limited · 13 of 46 slices shown (1 of 2)]
[im 1/46]
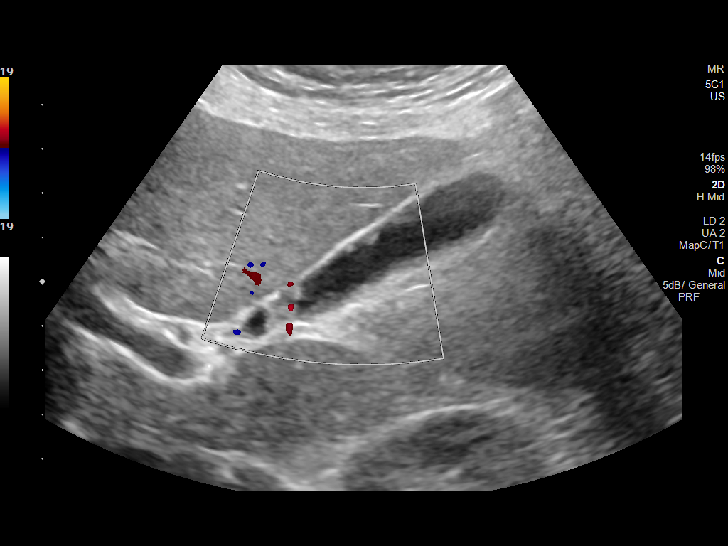
[im 4/46]
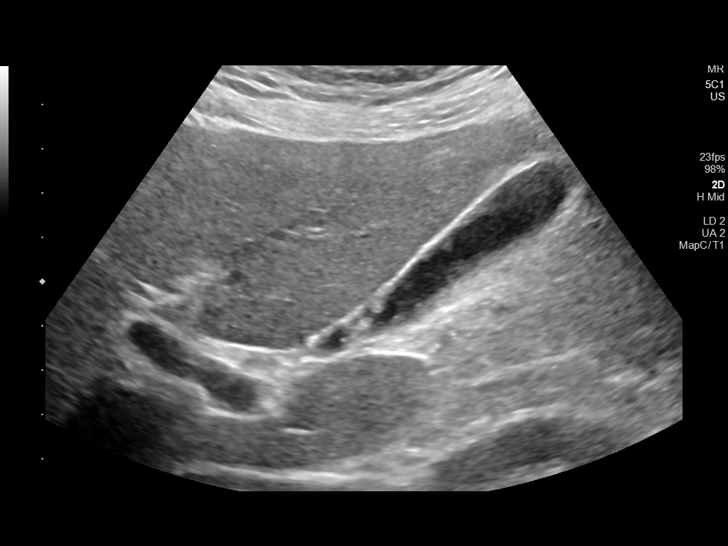
[im 8/46]
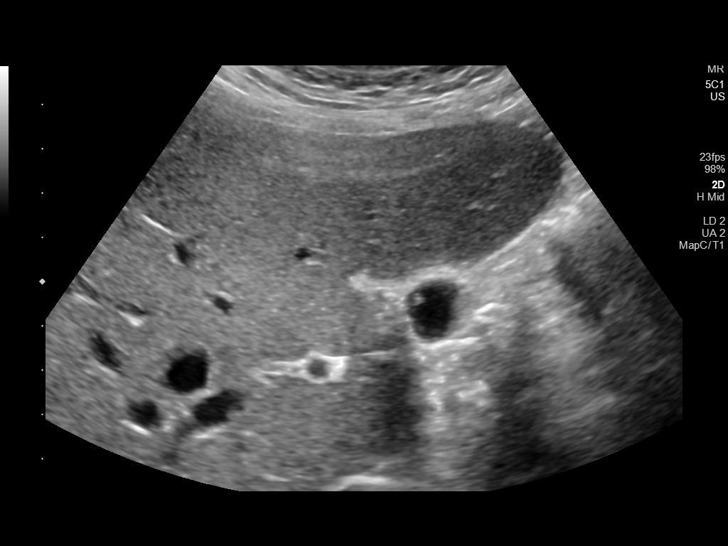
[im 12/46]
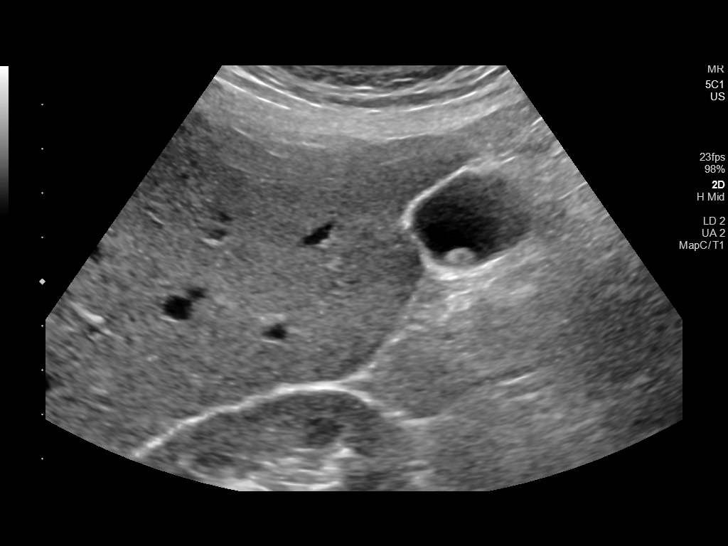
[im 16/46]
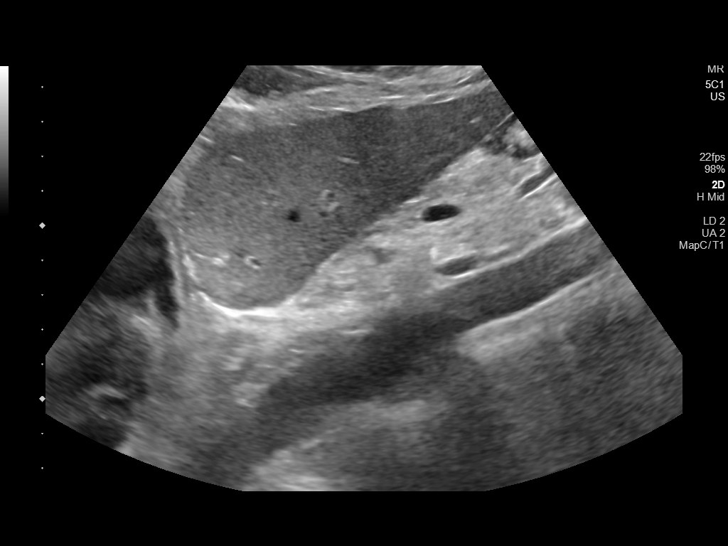
[im 18/46]
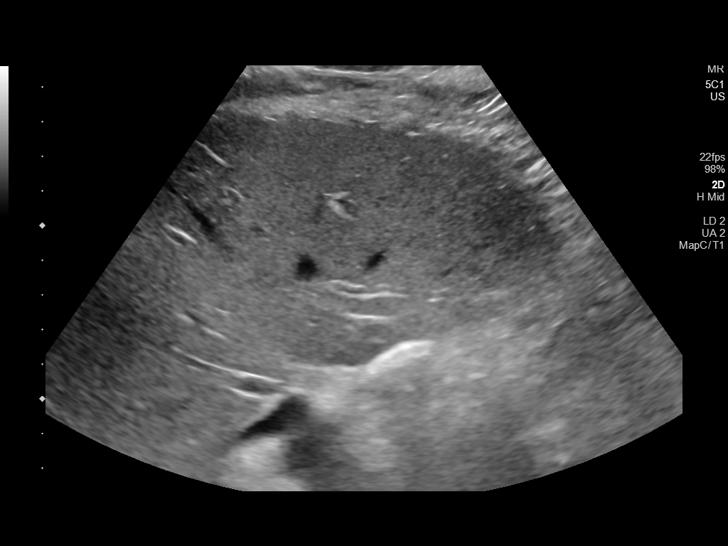
[im 22/46]
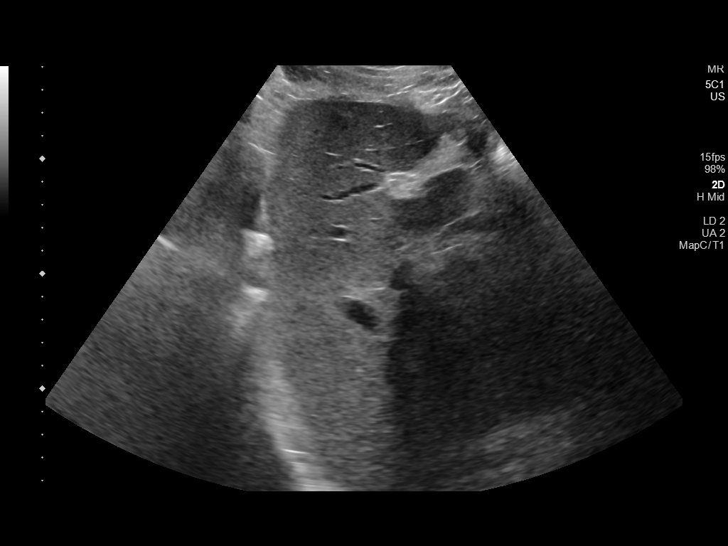
[im 26/46]
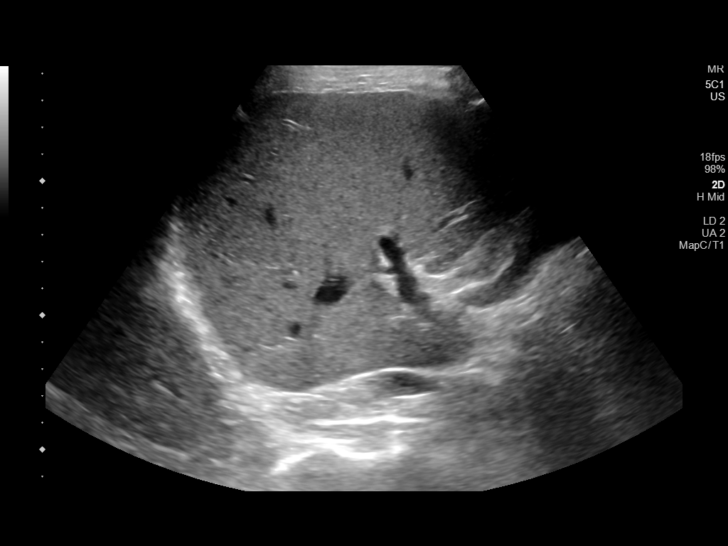
[im 30/46]
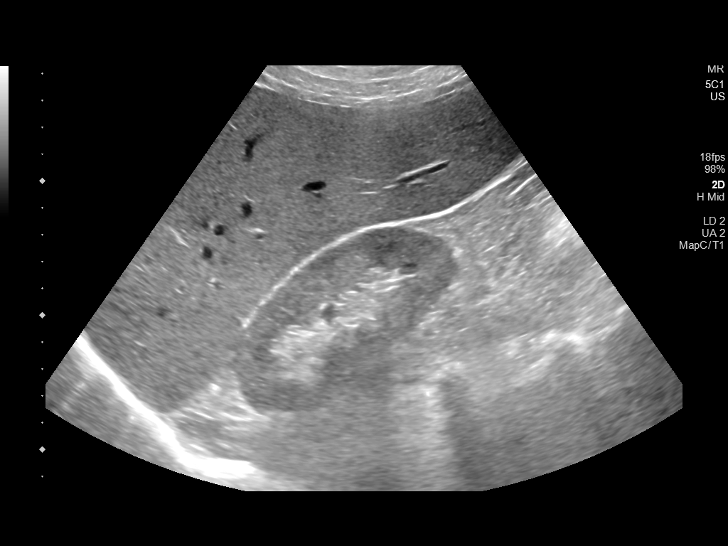
[im 32/46]
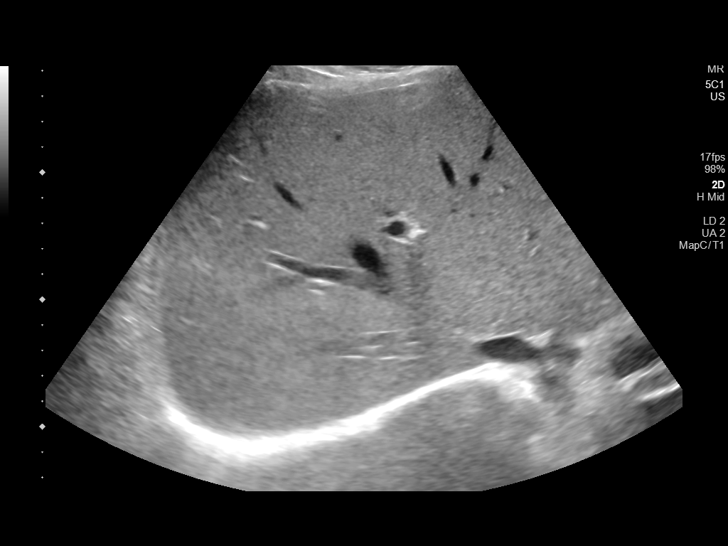
[im 36/46]
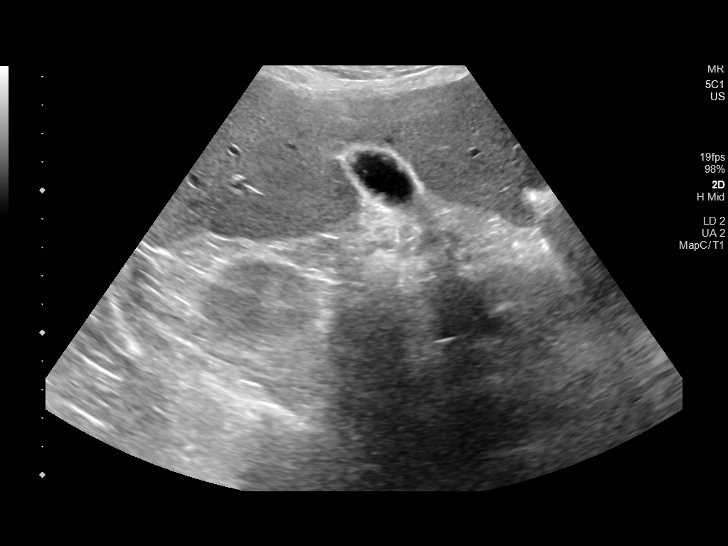
[im 40/46]
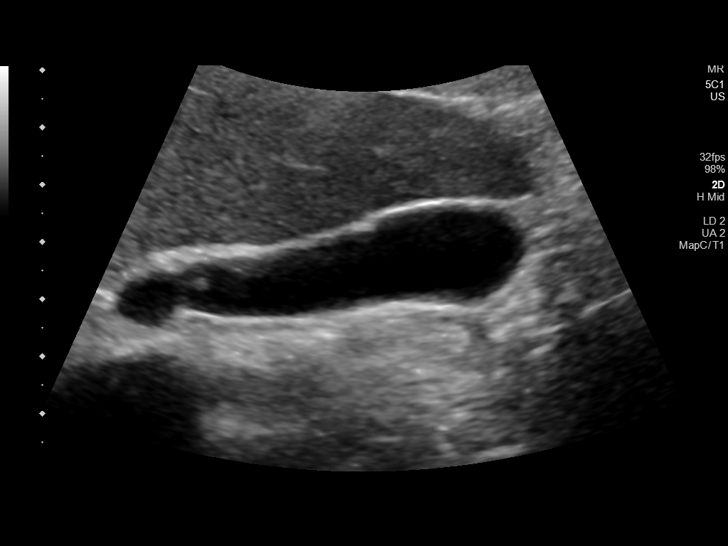
[im 44/46]
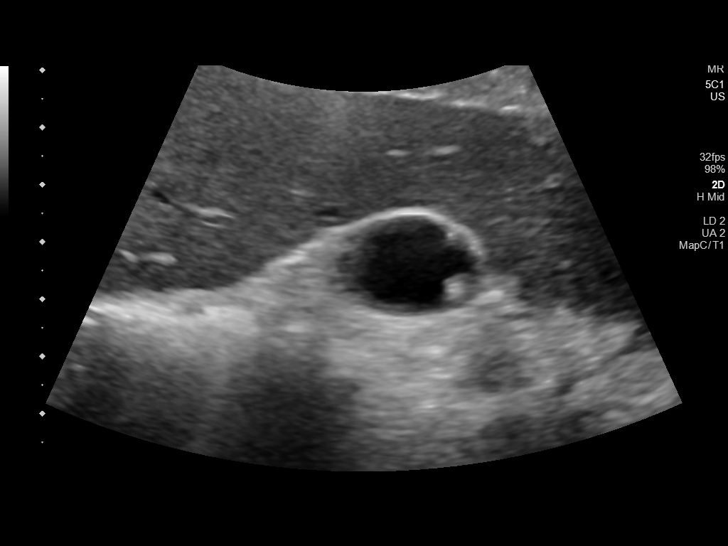

[Series 2: us abdomen limited · 1 of 1 slices shown (2 of 2)]
[im 1/1]
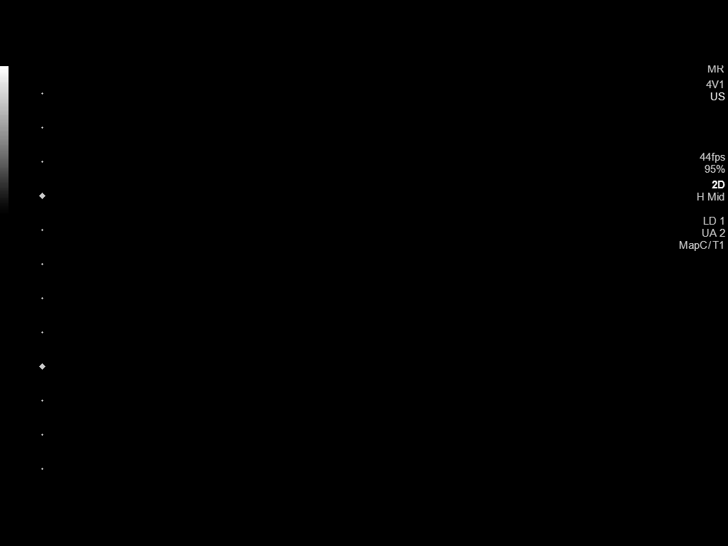

[14 of 25 positions shown; findings below may reference images not displayed]

FINDINGS: Gallbladder:

Multiple gallbladder polyps are noted measuring up to 4 mm. There
are multiple gallstones. There is no gallbladder wall thickening.
There is no pericholecystic free fluid.

Common bile duct:

Diameter: 3 mm

Liver:

No focal lesion identified. Within normal limits in parenchymal
echogenicity. Portal vein is patent on color Doppler imaging with
normal direction of blood flow towards the liver.

Other: None.
IMPRESSION: 1. There is cholelithiasis without secondary signs of acute
cholecystitis.
2. Multiple gallbladder polyps are noted measuring up to 4 mm. Per
consensus statement, gallbladder polyps less than 6 mm are usually
benign not requiring follow-up
